# Patient Record
Sex: Male | Born: 1945 | Race: White | Hispanic: No | Marital: Married | Smoking: Never smoker
Health system: Southern US, Community
[De-identification: ages and names within clinical notes are randomized; demographics above are authoritative.]

## PROBLEM LIST (undated history)

## (undated) DIAGNOSIS — M1711 Unilateral primary osteoarthritis, right knee: Secondary | ICD-10-CM

## (undated) DIAGNOSIS — M1611 Unilateral primary osteoarthritis, right hip: Secondary | ICD-10-CM

## (undated) DIAGNOSIS — I10 Essential (primary) hypertension: Secondary | ICD-10-CM

## (undated) DIAGNOSIS — G4730 Sleep apnea, unspecified: Secondary | ICD-10-CM

## (undated) DIAGNOSIS — M1990 Unspecified osteoarthritis, unspecified site: Secondary | ICD-10-CM

## (undated) DIAGNOSIS — M109 Gout, unspecified: Secondary | ICD-10-CM

## (undated) DIAGNOSIS — K219 Gastro-esophageal reflux disease without esophagitis: Secondary | ICD-10-CM

## (undated) DIAGNOSIS — E119 Type 2 diabetes mellitus without complications: Secondary | ICD-10-CM

## (undated) HISTORY — PX: TONSILLECTOMY: SUR1361

## (undated) HISTORY — PX: APPENDECTOMY: SHX54

## (undated) HISTORY — PX: SHOULDER ARTHROSCOPY W/ ROTATOR CUFF REPAIR: SHX2400

---

## 1979-07-11 ENCOUNTER — Encounter: Payer: Self-pay | Admitting: Pulmonary Disease

## 2005-03-19 ENCOUNTER — Ambulatory Visit (HOSPITAL_BASED_OUTPATIENT_CLINIC_OR_DEPARTMENT_OTHER): Admission: RE | Admit: 2005-03-19 | Discharge: 2005-03-19 | Payer: Self-pay | Admitting: Orthopedic Surgery

## 2005-03-19 ENCOUNTER — Ambulatory Visit (HOSPITAL_COMMUNITY): Admission: RE | Admit: 2005-03-19 | Discharge: 2005-03-19 | Payer: Self-pay | Admitting: Orthopedic Surgery

## 2006-09-16 DIAGNOSIS — E119 Type 2 diabetes mellitus without complications: Secondary | ICD-10-CM | POA: Insufficient documentation

## 2007-03-21 ENCOUNTER — Encounter: Admission: RE | Admit: 2007-03-21 | Discharge: 2007-03-21 | Payer: Self-pay | Admitting: Specialist

## 2010-11-09 NOTE — Op Note (Signed)
NAME:  Jeffery Friedman, HARIG NO.:  [id number]   MEDICAL RECORD NO.:  [id number]          PATIENT TYPE:  AMB   LOCATION:  DSC                          FACILITY:  [facility]   PHYSICIAN:  Leonides Grills, M.D.     DATE OF BIRTH:  1945/10/20   DATE OF PROCEDURE:  03/19/2005  DATE OF DISCHARGE:                                 OPERATIVE REPORT   PREOPERATIVE DIAGNOSES:  1.  Left Achilles tendinopathy.  2.  Left tight gastroc.   POSTOPERATIVE DIAGNOSES:  1.  Left Achilles tendinopathy.  2.  Left tight gastroc.   OPERATION/PROCEDURE:  1.  Left debridement of Achilles tendon.  2.  Left flexor hallucis longus to calcaneus transfer.  3.  Left gastroc side.   ANESTHESIA:  General.   SURGEON:  Leonides Grills, M.D.   ASSISTANT:  Lianne Cure, P.A.-C.   ESTIMATED BLOOD LOSS:  Minimal.   TOURNIQUET TIME:  Approximately 1 hour and 20 minutes.   COMPLICATIONS:  None.   DISPOSITION:  Stable to the PR.   INDICATIONS:  This is a 65 year old male who has had progressive  longstanding left Achilles tendinopathy and pain despite conservative  management.  He has come for the above procedure all risks which include  infection, neurovascular injury, contracture, persistent pain, worsening  pain, prolonged recovery, Achilles tendon rupture were all explained,  questions were encouraged and answered.   DESCRIPTION OF PROCEDURE:  The patient was brought to the operating room,  placed in the supine position.  After adequate general endotracheal  anesthesia was administered, as well as Ancef 1 g IV piggyback, the patient  was then placed in the sloppy lateral position with the operative site down.  All bony prominences were well padded on a bean bag. The left lower  extremity was then prepped and draped in the sterile manner with a proximal  placed thigh tourniquet.   A longitudinal incision over the medial aspect of the gastrocnemius muscle  tendinous junction was then made.   Dissection was carried down through the  skin and hemostasis was attained.  The fascia was opened in line with the  incision.  Conjoin region was then developed between the soleus and gastroc.  Soft tissue was then elevated off the posterior aspect of the gastrocnemius  tendon.  Sural nerve was identified and protected posteriorly throughout the  case.  The gastrocnemius tendon was then released with a curved Mayo  scissors __________  release the tight gastroc area.  The area was copiously  irrigated with normal saline.  Subcutaneous tissue was closed with 3-0  Vicryl.  Skin was closed with 4-0 Monocryl subcuticular stitch.  Steri-  Strips were applied.   We then __________  the left lower extremity and tourniquet was elevated 290  mmHg.  A longitudinal incision just anterior medial to the Achilles tendon  was then made.  Dissection was carried down through the skin and hemostasis  was obtained.  Fascia was opened in line with the incision.  Dissection was  carried down to the FHL tendon, protecting the neurovascular structures  medially.  The FHL tendon  was then identified and the fascia was then opened  proximally and distally.  The tendon was then traced to the __________  and  then with the ankle at maximum equinus and the great toe at maximal equinus,  the FHL tendon was tenotomized as distal as possible and then pulled through  the wound.  A #2 FiberWire was then placed in a modified Krackow-type stitch  into the distal aspect of the tendon measuring approximately 25 mm.  Once  this was done, we then prepared the superior aspect of the calcaneal tuber.  We then placed a guidewire into the superior aspect of the calcaneal tuber  and drilled 8 mm drill hole to a depth of about 30 mm.  We then chose an 8 x  23 mm [employer] tenodesis bio-absorbable screw and placed this on the  screwdriver with #2 FiberWire as a lasso, lassoed the tendon, pushed this  down deep into the calcaneal tunnel  to the deepest part of the canal and  then tightened this screw.  This had excellent purchase and maintenance of  the adequate tension and position that we assessed before tendon transfer.  The muscle belly was directly in front of the Achilles tendon.  At this  point we did a debridement of the Achilles tendon with longitudinal slits  within the Achilles tendon over the tendinopathy zone.  We then sewed the  FHL to the Achilles tendon distally with #2 FiberWire and then gently teased  and sewed the FHL muscle belly to the anterior aspect of the Achilles tendon  through epimysium and paratenon layers.  Once this was done, it was  excellent position.  We left the foot in a gravity equinus position.  Also  of note, the FHL was elevated off the posterior aspect of the tibia as well  the gain more excursion.  Tourniquet was deflated.  Hemostasis was obtained.  There was no pulsatile bleeding.  The subcutaneous was closed with 3-0  Vicryl.  Skin was closed with 4-0 nylon.  Sterile dressing was applied.  Modified Jones dressing was applied with the gravity equinus.  The patient  went stable to the PR.      Leonides Grills, M.D.  Electronically Signed     PB/MEDQ  D:  03/19/2005  T:  03/20/2005  Job:  [id number]

## 2013-03-03 ENCOUNTER — Encounter: Payer: Self-pay | Admitting: Internal Medicine

## 2013-03-03 ENCOUNTER — Ambulatory Visit (INDEPENDENT_AMBULATORY_CARE_PROVIDER_SITE_OTHER): Payer: Medicare Other | Admitting: Internal Medicine

## 2013-03-03 VITALS — BP 121/74 | HR 68 | Temp 98.1°F | Wt 232.0 lb

## 2013-03-03 DIAGNOSIS — A4902 Methicillin resistant Staphylococcus aureus infection, unspecified site: Secondary | ICD-10-CM

## 2013-03-03 LAB — CBC WITH DIFFERENTIAL/PLATELET
HCT: 37.8 % — ABNORMAL LOW (ref 39.0–52.0)
Hemoglobin: 12.9 g/dL — ABNORMAL LOW (ref 13.0–17.0)
Lymphocytes Relative: 28 % (ref 12–46)
Lymphs Abs: 1.4 10*3/uL (ref 0.7–4.0)
MCHC: 34.1 g/dL (ref 30.0–36.0)
Monocytes Absolute: 0.6 10*3/uL (ref 0.1–1.0)
Monocytes Relative: 11 % (ref 3–12)
Neutro Abs: 2.8 10*3/uL (ref 1.7–7.7)
RBC: 4.15 MIL/uL — ABNORMAL LOW (ref 4.22–5.81)
WBC: 4.9 10*3/uL (ref 4.0–10.5)

## 2013-03-03 LAB — BASIC METABOLIC PANEL
BUN: 23 mg/dL (ref 6–23)
CO2: 23 mEq/L (ref 19–32)
Chloride: 103 mEq/L (ref 96–112)
Glucose, Bld: 98 mg/dL (ref 70–99)
Potassium: 4.7 mEq/L (ref 3.5–5.3)

## 2013-03-03 MED ORDER — CHLORHEXIDINE GLUCONATE 4 % EX SOLN: 1.0000 "application " | Freq: Every day | CUTANEOUS | Status: DC

## 2013-03-03 NOTE — Progress Notes (Signed)
[facility] NOTE  RFV: community referral for MRSA SSTI of scalp Subjective:    Patient ID: Jeffery Friedman, male    DOB: 09-21-1945, 67 y.o.   MRN: [id number]  HPI  started having sizeable lumps, painful on scalp for roughly 4-6 wks. One as large as "golf ball" Itchy, crusted. Appeared to be as large on 3 x 3 cm ulcerated nodules Expressed at doctors office, then biopsied with culture results showing MRSA. Thus far they have shrunked considerably since being on bactrim DS 2 tab BID (which he previously was on 1 DS BID tab) since August 29th.  No fever or chills, but never occurred.   ROS: no chills, nausea, vomitting, diarrhea, sick contacts   Current outpatient prescriptions:allopurinol (ZYLOPRIM) 300 MG tablet, Take 300 mg by mouth daily., Disp: , Rfl: ;  amLODipine (NORVASC) 5 MG tablet, Take 5 mg by mouth daily., Disp: , Rfl: ;  aspirin 81 MG tablet, Take 81 mg by mouth daily., Disp: , Rfl: ;  azelastine (ASTELIN) 137 MCG/SPRAY nasal spray, Place 1 spray into the nose 2 (two) times daily. Use in each nostril as directed, Disp: , Rfl:  benazepril (LOTENSIN) 20 MG tablet, Take 20 mg by mouth daily., Disp: , Rfl: ;  fluticasone (VERAMYST) 27.5 MCG/SPRAY nasal spray, Place 2 sprays into the nose daily., Disp: , Rfl: ;  indomethacin (INDOCIN) 50 MG capsule, Take 50 mg by mouth 2 (two) times daily with a meal., Disp: , Rfl: ;  levocetirizine (XYZAL) 5 MG tablet, Take 5 mg by mouth every evening., Disp: , Rfl:  metFORMIN (GLUCOPHAGE) 1000 MG tablet, Take 500 mg by mouth 2 (two) times daily with a meal., Disp: , Rfl: ;  montelukast (SINGULAIR) 10 MG tablet, Take 10 mg by mouth at bedtime., Disp: , Rfl: ;  omeprazole (PRILOSEC) 20 MG capsule, Take 20 mg by mouth daily., Disp: , Rfl: ;  rosuvastatin (CRESTOR) 10 MG tablet, Take 10 mg by mouth every other day., Disp: , Rfl:  sulfamethoxazole-trimethoprim (BACTRIM DS) 800-160 MG per tablet, Take 2 tablets by mouth 2 (two) times daily., Disp: , Rfl:     Active Ambulatory Problems    Diagnosis Date Noted  . No Active Ambulatory Problems   Resolved Ambulatory Problems    Diagnosis Date Noted  . No Resolved Ambulatory Problems   No Additional Past Medical History   History  Substance Use Topics  . Smoking status: Never Smoker   . Smokeless tobacco: Never Used  . Alcohol Use: No  -retired, married, cares for grandchildren, -- they are in good state of health  family history is not on file.  Review of Systems  Constitutional: Negative for fever, chills, diaphoresis, activity change, appetite change, fatigue and unexpected weight change.  HENT: Negative for congestion, sore throat, rhinorrhea, sneezing, trouble swallowing and sinus pressure.  Eyes: Negative for photophobia and visual disturbance.  Respiratory: Negative for cough, chest tightness, shortness of breath, wheezing and stridor.  Cardiovascular: Negative for chest pain, palpitations and leg swelling.  Gastrointestinal: Negative for nausea, vomiting, abdominal pain, diarrhea, constipation, blood in stool, abdominal distention and anal bleeding.  Genitourinary: Negative for dysuria, hematuria, flank pain and difficulty urinating.  Musculoskeletal: Negative for myalgias, back pain, joint swelling, arthralgias and gait problem.  Skin: Negative for color change, pallor, rash and wound.  Neurological: Negative for dizziness, tremors, weakness and light-headedness.  Hematological: Negative for adenopathy. Does not bruise/bleed easily.  Psychiatric/Behavioral: Negative for behavioral problems, confusion, sleep disturbance, dysphoric mood, decreased concentration and agitation.  Objective:   Physical Exam BP 121/74  Pulse 68  Temp(Src) 98.1 F (36.7 C) (Oral)  Wt 232 lb (105.235 kg) Physical Exam  Constitutional: He is oriented to person, place, and time. He appears well-developed and well-nourished. No distress.  HENT:  Mouth/Throat: Oropharynx is clear and  moist. No oropharyngeal exudate.  Cardiovascular: Normal rate, regular rhythm and normal heart sounds. Exam reveals no gallop and no friction rub.  No murmur heard.  Pulmonary/Chest: Effort normal and breath sounds normal. No respiratory distress. He has no wheezes.  Abdominal: Soft. Bowel sounds are normal. He exhibits no distension. There is no tenderness.  Lymphadenopathy:  He has no cervical adenopathy.  Neurological: He is alert and oriented to person, place, and time.  Skin: vertex of head has 3 lesions, open ulcer, still has fibrinous exudate. Left sided lesions has still some purulent exudate that is expressed Psychiatric: He has a normal mood and affect. His behavior is normal.    Lab: tetracycline S, rif S, bactrim S, clinda S = 02/19/13 tissue culture     Assessment & Plan:  MRSA SSTI = currently on day 12 of 14 of bactrim DS 2 tab BID. Doing better but not completely resolved. Will do addn week of therapy. Will check cbc, bmp. Will do a 7 day course of CHG bath to help with decolonization  Cell: [phone]

## 2013-03-08 ENCOUNTER — Telehealth: Payer: Self-pay | Admitting: *Deleted

## 2013-03-08 ENCOUNTER — Encounter: Payer: Self-pay | Admitting: Internal Medicine

## 2013-03-08 NOTE — Telephone Encounter (Signed)
Pt email via MyChart. Please advise. Andree Coss, RN   I have not received the results of my blood work yet. Does that mean everything was O.K.? I finish my antibodic tomorrow--you said you may be ordering more for me depending on my blood work so I was wondering if I need more or just wait until I see you on the 22nd? Thanks Jeffery Friedman

## 2013-03-15 ENCOUNTER — Ambulatory Visit (INDEPENDENT_AMBULATORY_CARE_PROVIDER_SITE_OTHER): Payer: Medicare Other | Admitting: Internal Medicine

## 2013-03-15 ENCOUNTER — Encounter: Payer: Self-pay | Admitting: Internal Medicine

## 2013-03-15 VITALS — BP 126/72 | HR 71 | Temp 98.4°F | Ht 73.0 in | Wt 232.0 lb

## 2013-03-15 DIAGNOSIS — A4902 Methicillin resistant Staphylococcus aureus infection, unspecified site: Secondary | ICD-10-CM

## 2013-03-15 NOTE — Progress Notes (Signed)
[facility] NOTE  RFV: follow up on MRSA skin/deep tissue infection of scalp Subjective:    Patient ID: Jeffery Friedman, male    DOB: 1945/07/30, 67 y.o.   MRN: [id number]  HPI started having sizeable lumps, painful on scalp for roughly 2 months ago. One as large as "golf ball" Itchy, crusted. Appeared to be as large on 3 x 3 cm ulcerated nodules Expressed at doctors office, then biopsied with culture results showing MRSA. Thus far they have shrunked considerably since being on bactrim DS 2 tab BID (which he previously was on 1 DS BID tab) since August 29th.  Has not had any drainage for the last 4 days ago. Took his last dose of bactrim 1 wk ago. He noticed having acid reflux after stopping antibiotic. He double taking prilosec which help his symptoms. He also finished chg bathing.  No fever or chills, but never occurred.   Current Outpatient Prescriptions on File Prior to Visit  Medication Sig Dispense Refill  . allopurinol (ZYLOPRIM) 300 MG tablet Take 300 mg by mouth daily.      Marland Kitchen amLODipine (NORVASC) 5 MG tablet Take 5 mg by mouth daily.      Marland Kitchen aspirin 81 MG tablet Take 81 mg by mouth daily.      Marland Kitchen azelastine (ASTELIN) 137 MCG/SPRAY nasal spray Place 1 spray into the nose 2 (two) times daily. Use in each nostril as directed      . benazepril (LOTENSIN) 20 MG tablet Take 20 mg by mouth daily.      . fluticasone (VERAMYST) 27.5 MCG/SPRAY nasal spray Place 2 sprays into the nose daily.      . indomethacin (INDOCIN) 50 MG capsule Take 50 mg by mouth 2 (two) times daily with a meal.      . levocetirizine (XYZAL) 5 MG tablet Take 5 mg by mouth every evening.      . metFORMIN (GLUCOPHAGE) 1000 MG tablet Take 500 mg by mouth 2 (two) times daily with a meal.      . montelukast (SINGULAIR) 10 MG tablet Take 10 mg by mouth at bedtime.      Marland Kitchen omeprazole (PRILOSEC) 20 MG capsule Take 20 mg by mouth daily.      . rosuvastatin (CRESTOR) 10 MG tablet Take 10 mg by mouth every other day.      .  sulfamethoxazole-trimethoprim (BACTRIM DS) 800-160 MG per tablet Take 2 tablets by mouth 2 (two) times daily.       No current facility-administered medications on file prior to visit.   Active Ambulatory Problems    Diagnosis Date Noted  . No Active Ambulatory Problems   Resolved Ambulatory Problems    Diagnosis Date Noted  . No Resolved Ambulatory Problems   No Additional Past Medical History    Review of Systems no chills, nausea, vomitting, diarrhea, sick contacts    Objective:   Physical Exam BP 126/72  Pulse 71  Temp(Src) 98.4 F (36.9 C) (Oral)  Ht 6\' 1"  (1.854 m)  Wt 232 lb (105.235 kg)  BMI 30.62 kg/m2 gen = a x o by 3 in NAD Skin = lesions on scalp are well healed. Still has 1 cm scab to frontal lesion. Mild erythema, non blanching no fluctuance      Assessment & Plan:  mrsa skin infection = finished course of bactrim. Lesions are healing as expected. Will not need further antibiotics at this time.  rtc prn

## 2013-04-29 ENCOUNTER — Other Ambulatory Visit: Payer: Self-pay

## 2015-08-02 DIAGNOSIS — E785 Hyperlipidemia, unspecified: Secondary | ICD-10-CM | POA: Insufficient documentation

## 2015-08-02 DIAGNOSIS — I10 Essential (primary) hypertension: Secondary | ICD-10-CM | POA: Insufficient documentation

## 2015-08-02 DIAGNOSIS — K2100 Gastro-esophageal reflux disease with esophagitis, without bleeding: Secondary | ICD-10-CM | POA: Insufficient documentation

## 2015-08-02 DIAGNOSIS — K5730 Diverticulosis of large intestine without perforation or abscess without bleeding: Secondary | ICD-10-CM | POA: Insufficient documentation

## 2015-08-02 DIAGNOSIS — R21 Rash and other nonspecific skin eruption: Secondary | ICD-10-CM | POA: Insufficient documentation

## 2015-08-02 DIAGNOSIS — K449 Diaphragmatic hernia without obstruction or gangrene: Secondary | ICD-10-CM | POA: Insufficient documentation

## 2015-08-02 DIAGNOSIS — M109 Gout, unspecified: Secondary | ICD-10-CM | POA: Insufficient documentation

## 2015-08-02 DIAGNOSIS — M654 Radial styloid tenosynovitis [de Quervain]: Secondary | ICD-10-CM | POA: Insufficient documentation

## 2015-08-02 DIAGNOSIS — H9190 Unspecified hearing loss, unspecified ear: Secondary | ICD-10-CM | POA: Insufficient documentation

## 2016-05-23 ENCOUNTER — Encounter: Payer: Self-pay | Admitting: Pulmonary Disease

## 2016-05-23 ENCOUNTER — Ambulatory Visit (INDEPENDENT_AMBULATORY_CARE_PROVIDER_SITE_OTHER): Payer: Medicare Other | Admitting: Pulmonary Disease

## 2016-05-23 VITALS — BP 132/76 | HR 74 | Ht 73.0 in | Wt 248.4 lb

## 2016-05-23 DIAGNOSIS — G4733 Obstructive sleep apnea (adult) (pediatric): Secondary | ICD-10-CM | POA: Diagnosis not present

## 2016-05-23 NOTE — Progress Notes (Signed)
Subjective:    Patient ID: Jeffery Friedman, male    DOB: 1946-05-11, 70 y.o.   MRN: [id number]  HPI   This is the case of Jeffery Friedman, 70 y.o. Male, who was is being seen for OSA. He is a non smoker, has mild asthma. Not been diagnosed with COPD. He has allergic sinusitis.   As you very well know, patient was diagnosed with obstructive sleep apnea 2009. Prior to that, he had snoring, witnessed apneas, gasping, choking, unrefreshed sleep. He had a colonoscopy and was difficult to arouse when he received sedatives and he had O2 desaturation.  Patient had a sleep study done at [facility] in [place] in 2009. Study was done on 03/16/2008. It was a split-night study. AHI was 66. He was optimal on CPAP 12-13 centimeters water.   He has been using his CPAP machine since 2009. Through the years, CPAP machine has lost its luster. The water reservoir/heater  isn't working as well. Sometimes, machine does not deliver enough pressure. It's an old machine so it does not have an SD card. He slowly becoming symptomatic again. ESS 14.   (-) abnormal behavior in sleep.  He gets supplies in the mail. No download has been done for years.    Review of Systems  Constitutional: Negative.  Negative for fever and unexpected weight change.  HENT: Negative.  Negative for congestion, dental problem, ear pain, nosebleeds, postnasal drip, rhinorrhea, sinus pressure, sneezing, sore throat and trouble swallowing.   Eyes: Negative.  Negative for redness and itching.  Respiratory: Negative.  Negative for cough, chest tightness, shortness of breath and wheezing.   Cardiovascular: Negative.  Negative for palpitations and leg swelling.  Gastrointestinal: Negative.  Negative for nausea and vomiting.  Endocrine: Negative.   Genitourinary: Negative.  Negative for dysuria.  Musculoskeletal: Negative.  Negative for joint swelling.  Skin: Negative.  Negative for rash.  Allergic/Immunologic: Positive for environmental  allergies.  Neurological: Negative.  Negative for headaches.  Hematological: Bruises/bleeds easily.  Psychiatric/Behavioral: Negative.  Negative for dysphoric mood. The patient is not nervous/anxious.    No past medical history on file.  (-) CAD, CA, CVA (+) DM, HTN (+) allergic sinusitis (-) DVT (+) Gout  No family history on file.  Parents are deceased. Mother had lymphatic CA. Father had PVD.   No past surgical history on file.  S/P R shoulder and R knee and L ankle sugery.   Social History   Social History  . Marital status: Married    Spouse name: N/A  . Number of children: N/A  . Years of education: N/A   Occupational History  . Not on file.   Social History Main Topics  . Smoking status: Never Smoker  . Smokeless tobacco: Never Used  . Alcohol use No  . Drug use: No  . Sexual activity: Yes    Partners: Female   Other Topics Concern  . Not on file   Social History Narrative  . No narrative on file     Allergies  Allergen Reactions  . Hydrocodone-Homatropine Other (See Comments)    unknown   Married, lives in [place], worked at [employer] in [place] and has been in [employer] since 2005. Moderate ETOH.   Outpatient Medications Prior to Visit  Medication Sig Dispense Refill  . allopurinol (ZYLOPRIM) 300 MG tablet Take 300 mg by mouth daily.    Marland Kitchen amLODipine (NORVASC) 5 MG tablet Take 5 mg by mouth daily.    Marland Kitchen aspirin  81 MG tablet Take 81 mg by mouth daily.    Marland Kitchen. azelastine (ASTELIN) 137 MCG/SPRAY nasal spray Place 1 spray into the nose 2 (two) times daily. Use in each nostril as directed    . benazepril (LOTENSIN) 20 MG tablet Take 20 mg by mouth daily.    . indomethacin (INDOCIN) 50 MG capsule Take 50 mg by mouth 2 (two) times daily with a meal.    . levocetirizine (XYZAL) 5 MG tablet Take 5 mg by mouth every evening.    . metFORMIN (GLUCOPHAGE) 1000 MG tablet Take 500 mg by mouth 2 (two) times daily with a meal.    . montelukast (SINGULAIR) 10 MG tablet  Take 10 mg by mouth at bedtime.    . rosuvastatin (CRESTOR) 10 MG tablet Take 10 mg by mouth every other day.    Marland Kitchen. omeprazole (PRILOSEC) 20 MG capsule Take 20 mg by mouth daily.    . fluticasone (VERAMYST) 27.5 MCG/SPRAY nasal spray Place 2 sprays into the nose daily.    Marland Kitchen. sulfamethoxazole-trimethoprim (BACTRIM DS) 800-160 MG per tablet Take 2 tablets by mouth 2 (two) times daily.     No facility-administered medications prior to visit.    Meds ordered this encounter  Medications  . diclofenac (VOLTAREN) 75 MG EC tablet    Sig: Take 75 mg by mouth.  . fluticasone (FLONASE) 50 MCG/ACT nasal spray    Sig: USE 1-2 SPRAYS IN EACH NOSTRIL EVERY DAY  . pantoprazole (PROTONIX) 40 MG tablet    Sig: TAKE 1 TABLET DAILY        Objective:   Physical Exam  Vitals:  Vitals:   05/23/16 0939  BP: 132/76  Pulse: 74  SpO2: 95%  Weight: 248 lb 6.4 oz (112.7 kg)  Height: 6\' 1"  (1.854 m)    Constitutional/General:  Pleasant, well-nourished, well-developed, not in any distress,  Comfortably seating.  Well kempt  Body mass index is 32.77 kg/m. Wt Readings from Last 3 Encounters:  05/23/16 248 lb 6.4 oz (112.7 kg)  03/15/13 232 lb (105.2 kg)  03/03/13 232 lb (105.2 kg)    Neck circumference:   HEENT: Pupils equal and reactive to light and accommodation. Anicteric sclerae. Normal nasal mucosa.   No oral  lesions,  mouth clear,  oropharynx clear, no postnasal drip. (-) Oral thrush. No dental caries.  Airway - Mallampati class III  Neck: No masses. Midline trachea. No JVD, (-) LAD. (-) bruits appreciated.  Respiratory/Chest: Grossly normal chest. (-) deformity. (-) Accessory muscle use.  Symmetric expansion. (-) Tenderness on palpation.  Resonant on percussion.  Diminished BS on both lower lung zones. (-) wheezing, crackles, rhonchi (-) egophony  Cardiovascular: Regular rate and  rhythm, heart sounds normal, Gr 2 apical murmur. (-) gallops, no peripheral edema  Gastrointestinal:   Normal bowel sounds. Soft, non-tender. No hepatosplenomegaly.  (-) masses.   Musculoskeletal:  Normal muscle tone. Normal gait.   Extremities: Grossly normal. (-) clubbing, cyanosis.  (-) edema  Skin: (-) rash,lesions seen.   Neurological/Psychiatric : alert, oriented to time, place, person. Normal mood and affect          Assessment & Plan:  OSA (obstructive sleep apnea) Patient was diagnosed with obstructive sleep apnea 2009. Prior to that, he had snoring, witnessed apneas, gasping, choking, unrefreshed sleep. He had a colonoscopy and was difficult to arouse when he received sedatives and he had O2 desaturation.  Patient had [facility] done [facility] in [place] in 2009. Study was  done on 03/16/2008. It was a split-night study. AHI was 66. He was optimal on CPAP 12-13 centimeters water.   He has been using his CPAP machine since 2009. Through the years, CPAP machine has lost its luster. The water reservoir/heater  isn't working as well. Sometimes, machine does not deliver enough pressure. It's an old machine so it does not have an SD card. He slowly becoming symptomatic again. ESS 14.   (-) abnormal behavior in sleep.  He gets supplies in the mail. No download has been done for years.   Plan :  We extensively discussed the diagnosis, pathophysiology, and treatment options for Obstructive Sleep Apnea (OSA).  We discussed treatment options for OSA including CPAP, BiPaP, as well as surgical options and oral devices.   We will start patient on autocpap 5-15 cm water. We have a copy of his sleep study done in 2009. We will try to order an auto CPAP without repeating the study. He now has Medicare so most likely Medicare will want him to have another study. If ever, he will have a split-night lab study.  Patient was instructed to call the office if he/she has not received the PAP device in 1-2 weeks.  Patient was instructed to have mask, tubings, filter, reservoir  cleaned at least once a week with soapy water.  Patient was instructed to call the office if he/she is having issues with the PAP device.    I advised patient to obtain sufficient amount of sleep --  7 to 8 hours at least in a 24 hr period.  Patient was advised to follow good sleep hygiene.  Patient was advised NOT to engage in activities requiring concentration and/or vigilance if he/she is and  sleepy.  Patient is NOT to drive if he/she is sleepy.        Thank you very much for letting me participate in this patient's care. Please do not hesitate to give me a call if you have any questions or concerns regarding the treatment plan.   Patient will follow up with me in 6-8 weeks.     Pollie MeyerJ. Angelo A. de Dios, MD 05/23/2016   10:14 AM Pulmonary and Critical Care Medicine [facility] Pager: [phone] Office: [phone], Fax: [phone]

## 2016-05-23 NOTE — Patient Instructions (Signed)
   It was a pleasure taking care of you today!  You are diagnosed with Obstructive Sleep Apnea or OSA.  You stop breathing  66   times an hour.   We will order you an autoCPAP  machine. Medicare might want to to have a lab study. We will keep you posted. Please call the office if you do NOT receive your machine in the next 1-2 weeks.   Please make sure you use your CPAP device everytime you sleep.  We will monitor the usage of your machine per your insurance requirement.  Your insurance company may take the machine from you if you are not using it regularly.   Please clean the mask, tubings, filter, water reservoir with soapy water every week.  Please use distilled water for the water reservoir.   Please call the office or your machine provider (DME company) if you are having issues with the device.   Return to clinic in 6-8 weeks with Dr. Christene Slatese Dios or NP.

## 2016-05-23 NOTE — Assessment & Plan Note (Signed)
Patient was diagnosed with obstructive sleep apnea 2009. Prior to that, he had snoring, witnessed apneas, gasping, choking, unrefreshed sleep. He had a colonoscopy and was difficult to arouse when he received sedatives and he had O2 desaturation.  Patient had a sleep study done at [facility] in [place] in 2009. Study was done on 03/16/2008. It was a split-night study. AHI was 66. He was optimal on CPAP 12-13 centimeters water.   He has been using his CPAP machine since 2009. Through the years, CPAP machine has lost its luster. The water reservoir/heater  isn't working as well. Sometimes, machine does not deliver enough pressure. It's an old machine so it does not have an SD card. He slowly becoming symptomatic again. ESS 14.   (-) abnormal behavior in sleep.  He gets supplies in the mail. No download has been done for years.   Plan :  We extensively discussed the diagnosis, pathophysiology, and treatment options for Obstructive Sleep Apnea (OSA).  We discussed treatment options for OSA including CPAP, BiPaP, as well as surgical options and oral devices.   We will start patient on autocpap 5-15 cm water. We have a copy of his sleep study done in 2009. We will try to order an auto CPAP without repeating the study. He now has Medicare so most likely Medicare will want him to have another study. If ever, he will have a split-night lab study.  Patient was instructed to call the office if he/she has not received the PAP device in 1-2 weeks.  Patient was instructed to have mask, tubings, filter, reservoir cleaned at least once a week with soapy water.  Patient was instructed to call the office if he/she is having issues with the PAP device.    I advised patient to obtain sufficient amount of sleep --  7 to 8 hours at least in a 24 hr period.  Patient was advised to follow good sleep hygiene.  Patient was advised NOT to engage in activities requiring concentration and/or vigilance if he/she is  and  sleepy.  Patient is NOT to drive if he/she is sleepy.

## 2016-07-05 ENCOUNTER — Ambulatory Visit: Payer: Medicare Other | Admitting: Adult Health

## 2016-07-12 ENCOUNTER — Ambulatory Visit (INDEPENDENT_AMBULATORY_CARE_PROVIDER_SITE_OTHER): Payer: Medicare Other | Admitting: Adult Health

## 2016-07-12 ENCOUNTER — Encounter: Payer: Self-pay | Admitting: Adult Health

## 2016-07-12 DIAGNOSIS — Z6839 Body mass index (BMI) 39.0-39.9, adult: Secondary | ICD-10-CM | POA: Diagnosis not present

## 2016-07-12 DIAGNOSIS — E6609 Other obesity due to excess calories: Secondary | ICD-10-CM | POA: Diagnosis not present

## 2016-07-12 DIAGNOSIS — G4733 Obstructive sleep apnea (adult) (pediatric): Secondary | ICD-10-CM

## 2016-07-12 DIAGNOSIS — E669 Obesity, unspecified: Secondary | ICD-10-CM | POA: Insufficient documentation

## 2016-07-12 DIAGNOSIS — IMO0001 Reserved for inherently not codable concepts without codable children: Secondary | ICD-10-CM

## 2016-07-12 NOTE — Progress Notes (Signed)
@Patient  ID: Jeffery Friedman, male    DOB: [birth year], 71 y.o.   MRN: [id number]  Chief Complaint  Patient presents with  . Follow-up    OSA     Referring provider: Barbie Banner, MD  HPI: 71 yo male followed for severe OSA   TEST  sleep study done at [facility] in [place] in 2009. Study was done on 03/16/2008. It was a split-night study. AHI was 66. He was optimal on CPAP 12-13 centimeters water.   07/12/2016 Follow up : OSA  Pt returns for 2 month follow up . He recently got a new CPAP machine and is doing very well.  Likes the auto set , more comfortable. Feels rested.  He wears every night for 8-9 hr .  Denies daytime sleepiness.  Download shows excellent compliance with avg usage at 9hr . AHI 2.7 , min leaks.     Allergies  Allergen Reactions  . Hydrocodone-Homatropine Other (See Comments)    unknown    Immunization History  Administered Date(s) Administered  . Influenza, High Dose Seasonal PF 04/24/2016    No past medical history on file.  Tobacco History: History  Smoking Status  . Never Smoker  Smokeless Tobacco  . Never Used   Counseling given: Not Answered   Outpatient Encounter Prescriptions as of 07/12/2016  Medication Sig  . allopurinol (ZYLOPRIM) 300 MG tablet Take 300 mg by mouth daily.  Marland Kitchen amLODipine (NORVASC) 5 MG tablet Take 5 mg by mouth daily.  Marland Kitchen aspirin 81 MG tablet Take 81 mg by mouth daily.  Marland Kitchen azelastine (ASTELIN) 137 MCG/SPRAY nasal spray Place 1 spray into the nose 2 (two) times daily. Use in each nostril as directed  . benazepril (LOTENSIN) 20 MG tablet Take 20 mg by mouth daily.  . fluticasone (FLONASE) 50 MCG/ACT nasal spray USE 1-2 SPRAYS IN EACH NOSTRIL EVERY DAY  . indomethacin (INDOCIN) 50 MG capsule Take 50 mg by mouth 2 (two) times daily with a meal.  . levocetirizine (XYZAL) 5 MG tablet Take 5 mg by mouth every evening.  . metFORMIN (GLUCOPHAGE) 1000 MG tablet Take 500 mg by mouth 2 (two) times daily with a meal.  .  montelukast (SINGULAIR) 10 MG tablet Take 10 mg by mouth at bedtime.  Marland Kitchen omeprazole (PRILOSEC) 20 MG capsule Take 20 mg by mouth daily.  . pantoprazole (PROTONIX) 40 MG tablet TAKE 1 TABLET DAILY  . rosuvastatin (CRESTOR) 10 MG tablet Take 10 mg by mouth every other day.  . [DISCONTINUED] diclofenac (VOLTAREN) 75 MG EC tablet Take 75 mg by mouth.   No facility-administered encounter medications on file as of 07/12/2016.      Review of Systems  Constitutional:   No  weight loss, night sweats,  Fevers, chills, fatigue, or  lassitude.  HEENT:   No headaches,  Difficulty swallowing,  Tooth/dental problems, or  Sore throat,                No sneezing, itching, ear ache, nasal congestion, post nasal drip,   CV:  No chest pain,  Orthopnea, PND, swelling in lower extremities, anasarca, dizziness, palpitations, syncope.   GI  No heartburn, indigestion, abdominal pain, nausea, vomiting, diarrhea, change in bowel habits, loss of appetite, bloody stools.   Resp: No shortness of breath with exertion or at rest.  No excess mucus, no productive cough,  No non-productive cough,  No coughing up of blood.  No change in color of mucus.  No wheezing.  No  chest wall deformity  Skin: no rash or lesions.  GU: no dysuria, change in color of urine, no urgency or frequency.  No flank pain, no hematuria   MS:  No joint pain or swelling.  No decreased range of motion.  No back pain.    Physical Exam  BP 138/70   Pulse 66   Temp 97.9 F (36.6 C) (Oral)   Ht 6\' 1"  (1.854 m)   Wt 247 lb 6.4 oz (112.2 kg)   SpO2 96%   BMI 32.64 kg/m   GEN: A/Ox3; pleasant , NAD, obese    HEENT:  [place]/AT,  EACs-clear, TMs-wnl, NOSE-clear, THROAT-clear, no lesions, no postnasal drip or exudate noted. Class 3 MP airway   NECK:  Supple w/ fair ROM; no JVD; normal carotid impulses w/o bruits; no thyromegaly or nodules palpated; no lymphadenopathy.    RESP  Clear  P & A; w/o, wheezes/ rales/ or rhonchi. no accessory muscle  use, no dullness to percussion  CARD:  RRR, no m/r/g, tr  peripheral edema, pulses intact, no cyanosis or clubbing.  GI:   Soft & nt; nml bowel sounds; no organomegaly or masses detected.   Musco: Warm bil, no deformities or joint swelling noted.   Neuro: alert, no focal deficits noted.    Skin: Warm, no lesions or rashes  Psych:  No change in mood or affect. No depression or anxiety.  No memory loss.  Lab Results:  CBC    Component Value Date/Time   WBC 4.9 03/03/2013 1721   RBC 4.15 (L) 03/03/2013 1721   HGB 12.9 (L) 03/03/2013 1721   HCT 37.8 (L) 03/03/2013 1721   PLT 238 03/03/2013 1721   MCV 91.1 03/03/2013 1721   MCH 31.1 03/03/2013 1721   MCHC 34.1 03/03/2013 1721   RDW 15.3 03/03/2013 1721   LYMPHSABS 1.4 03/03/2013 1721   MONOABS 0.6 03/03/2013 1721   EOSABS 0.2 03/03/2013 1721   BASOSABS 0.0 03/03/2013 1721    BMET    Component Value Date/Time   NA 136 03/03/2013 1721   K 4.7 03/03/2013 1721   CL 103 03/03/2013 1721   CO2 23 03/03/2013 1721   GLUCOSE 98 03/03/2013 1721   BUN 23 03/03/2013 1721   CREATININE 1.52 (H) 03/03/2013 1721   CALCIUM 9.2 03/03/2013 1721    BNP No results found for: BNP  ProBNP No results found for: PROBNP  Imaging: No results found.   Assessment & Plan:   OSA (obstructive sleep apnea) Well controlled on CPAP   Plan  Patient Instructions  Keep up good work.  Wear CPAP At bedtime  .  Do not drive if sleepy .  Work on weight loss.  Follow up with Dr. Christene Slatese Dios in 4-6 months and As needed       Obesity Weight loss encouarged.      Rubye Oaksammy Crytal Pensinger, NP 07/12/2016

## 2016-07-12 NOTE — Assessment & Plan Note (Signed)
Weight loss encouarged.  

## 2016-07-12 NOTE — Assessment & Plan Note (Signed)
Well controlled on CPAP   Plan  Patient Instructions  Keep up good work.  Wear CPAP At bedtime  .  Do not drive if sleepy .  Work on weight loss.  Follow up with Dr. Christene Slatese Dios in 4-6 months and As needed

## 2016-07-12 NOTE — Patient Instructions (Signed)
Keep up good work.  Wear CPAP At bedtime  .  Do not drive if sleepy .  Work on weight loss.  Follow up with Dr. Christene Slatese Dios in 4-6 months and As needed

## 2016-09-15 DIAGNOSIS — Z96641 Presence of right artificial hip joint: Secondary | ICD-10-CM | POA: Insufficient documentation

## 2016-12-10 ENCOUNTER — Ambulatory Visit: Payer: Medicare Other | Admitting: Pulmonary Disease

## 2016-12-18 ENCOUNTER — Ambulatory Visit: Payer: Medicare Other | Admitting: Pulmonary Disease

## 2017-02-02 ENCOUNTER — Encounter: Payer: Self-pay | Admitting: Internal Medicine

## 2017-02-03 ENCOUNTER — Ambulatory Visit (INDEPENDENT_AMBULATORY_CARE_PROVIDER_SITE_OTHER): Payer: Medicare Other | Admitting: Internal Medicine

## 2017-02-03 ENCOUNTER — Encounter: Payer: Self-pay | Admitting: Internal Medicine

## 2017-02-03 DIAGNOSIS — G4733 Obstructive sleep apnea (adult) (pediatric): Secondary | ICD-10-CM

## 2017-02-03 DIAGNOSIS — Z6835 Body mass index (BMI) 35.0-35.9, adult: Secondary | ICD-10-CM

## 2017-02-03 NOTE — Assessment & Plan Note (Signed)
Doing extremely well. He likes his machine and mask. Pressures are comfortable and compliance is excellent with good control. He clearly benefits. Plan-continue auto 5-15/[employer]

## 2017-02-03 NOTE — Assessment & Plan Note (Signed)
He would help him to lose a few pounds as discussed.

## 2017-02-03 NOTE — Progress Notes (Signed)
HPI LOV with NP 06/2016-  sleep study done at [facility] in [place] in 2009. Study was done on 03/16/2008. It was a split-night study. AHI was 66. He was optimal on CPAP 12-13 centimeters water.   07/12/2016 Follow up : OSA  Pt returns for 2 month follow up . He recently got a new CPAP machine and is doing very well.  Likes the auto set , more comfortable. Feels rested.  He wears every night for 8-9 hr .  Denies daytime sleepiness.  Download shows excellent compliance with avg usage at 9hr . AHI 2.7 , min leaks.  02/03/17-71 year old male never smoker followed for OSA. Complicating medical problems include morbid obesity Pt states that his CPAP machine is still working good for him. Denies any complaints or concerns. CPAP auto 5-15/ [employer] Download- 100% compliance, AHI 2.3/hour. He averages 8 hours and 37 minutes usage each night. He doesn't want to sleep without CPAP and feels very much better using it. Nasal pillows mask. No concerns or changes requested.  ROS-see HPI    "+" = pos Constitutional:    weight loss, night sweats, fevers, chills, fatigue, lassitude. HEENT:    headaches, difficulty swallowing, tooth/dental problems, sore throat,       sneezing, itching, ear ache, nasal congestion, post nasal drip, snoring CV:    chest pain, orthopnea, PND, swelling in lower extremities, anasarca,                                                   dizziness, palpitations Resp:   shortness of breath with exertion or at rest.               productive cough,   non-productive cough, coughing up of blood.              change in color of mucus.  wheezing.   Skin:    rash or lesions. GI:  No-   heartburn, indigestion, abdominal pain, nausea, vomiting, diarrhea,                 change in bowel habits, loss of appetite GU: dysuria, change in color of urine, no urgency or frequency.   flank pain. MS:   joint pain, stiffness, decreased range of motion, back pain. Neuro-     nothing  unusual Psych:  change in mood or affect.  depression or anxiety.   memory loss.  OBJ- Physical Exam General- Alert, Oriented, Affect-appropriate, Distress- none acute, + overweight Skin- rash-none, lesions- none, excoriation- none Lymphadenopathy- none Head- atraumatic            Eyes- Gross vision intact, PERRLA, conjunctivae and secretions clear            Ears- Hearing, canals-normal            Nose- Clear, no-Septal dev, mucus, polyps, erosion, perforation             Throat- Mallampati IV , mucosa clear , drainage- none, tonsils- atrophic Neck- flexible , trachea midline, no stridor , thyroid nl, carotid no bruit Chest - symmetrical excursion , unlabored           Heart/CV- RRR , no murmur , no gallop  , no rub, nl s1 s2                           -  JVD- none , edema- none, stasis changes- none, varices- none           Lung- clear to P&A, wheeze- none, cough- none , dullness-none, rub- none           Chest wall-  Abd-  Br/ Gen/ Rectal- Not done, not indicated Extrem- cyanosis- none, clubbing, none, atrophy- none, strength- nl Neuro- grossly intact to observation   .

## 2017-02-03 NOTE — Patient Instructions (Signed)
We can continue with CPAP auto 5-15, mask of choice, humidifier, supplies, AirView   Dx OSA  Please call if we can help

## 2017-09-03 ENCOUNTER — Other Ambulatory Visit: Payer: Self-pay | Admitting: Orthopedic Surgery

## 2017-09-04 NOTE — Pre-Procedure Instructions (Signed)
Navon Kotowski  09/04/2017      [id number] - [place], [place] - [address] [place] [address] Phone: [phone] Fax: [phone]    Your procedure is scheduled on March 26  Report to [facility] Admitting at 0530 A.M.  Call this number if you have problems the morning of surgery:  [phone]   Remember:  Do not eat food or drink liquids after midnight.  Continue all medications as directed by your physician except follow these medication instructions before surgery below   Take these medicines the morning of surgery with A SIP OF WATER  allopurinol (ZYLOPRIM) azelastine (ASTELIN) Eye drops if needed fluticasone (FLONASE pantoprazole (PROTONIX)  7 days prior to surgery STOP taking any Aspirin(unless otherwise instructed by your surgeon), Aleve, Naproxen, Ibuprofen, Motrin, Advil, Goody's, BC's, all herbal medications, fish oil, and all vitamins   WHAT DO I DO ABOUT MY DIABETES MEDICATION?   Marland Kitchen Do not take oral diabetes medicines (pills) the morning of surgery. metFORMIN (GLUCOPHAGE)    How to Manage Your Diabetes Before and After Surgery  Why is it important to control my blood sugar before and after surgery? . Improving blood sugar levels before and after surgery helps healing and can limit problems. . A way of improving blood sugar control is eating a healthy diet by: o  Eating less sugar and carbohydrates o  Increasing activity/exercise o  Talking with your doctor about reaching your blood sugar goals . High blood sugars (greater than 180 mg/dL) can raise your risk of infections and slow your recovery, so you will need to focus on controlling your diabetes during the weeks before surgery. . Make sure that the doctor who takes care of your diabetes knows about your planned surgery including the date and location.  How do I manage my blood sugar before surgery? . Check your blood  sugar at least 4 times a day, starting 2 days before surgery, to make sure that the level is not too high or low. o Check your blood sugar the morning of your surgery when you wake up and every 2 hours until you get to the Short Stay unit. . If your blood sugar is less than 70 mg/dL, you will need to treat for low blood sugar: o Do not take insulin. o Treat a low blood sugar (less than 70 mg/dL) with  cup of clear juice (cranberry or apple), 4 glucose tablets, OR glucose gel. o Recheck blood sugar in 15 minutes after treatment (to make sure it is greater than 70 mg/dL). If your blood sugar is not greater than 70 mg/dL on recheck, call [phone] for further instructions. . Report your blood sugar to the short stay nurse when you get to Short Stay.  . If you are admitted to the hospital after surgery: o Your blood sugar will be checked by the staff and you will probably be given insulin after surgery (instead of oral diabetes medicines) to make sure you have good blood sugar levels. o The goal for blood sugar control after surgery is 80-180 mg/dL.    Do not wear jewelry, make-up or nail polish.  Do not wear lotions, powders, or perfumes, or deodorant.  Do not shave 48 hours prior to surgery.  Men may shave face and neck.  Do not bring valuables to the hospital.  [facility] is not responsible for any belongings or valuables.  Contacts,  dentures or bridgework may not be worn into surgery.  Leave your suitcase in the car.  After surgery it may be brought to your room.  For patients admitted to the hospital, discharge time will be determined by your treatment team.  Patients discharged the day of surgery will not be allowed to drive home.    Special instructions:   [place]- Preparing For Surgery  Before surgery, you can play an important role. Because skin is not sterile, your skin needs to be as free of germs as possible. You can reduce the number of germs on your skin by washing  with CHG (chlorahexidine gluconate) Soap before surgery.  CHG is an antiseptic cleaner which kills germs and bonds with the skin to continue killing germs even after washing.  Please do not use if you have an allergy to CHG or antibacterial soaps. If your skin becomes reddened/irritated stop using the CHG.  Do not shave (including legs and underarms) for at least 48 hours prior to first CHG shower. It is OK to shave your face.  Please follow these instructions carefully.   1. Shower the NIGHT BEFORE SURGERY and the MORNING OF SURGERY with CHG.   2. If you chose to wash your hair, wash your hair first as usual with your normal shampoo.  3. After you shampoo, rinse your hair and body thoroughly to remove the shampoo.  4. Use CHG as you would any other liquid soap. You can apply CHG directly to the skin and wash gently with a scrungie or a clean washcloth.   5. Apply the CHG Soap to your body ONLY FROM THE NECK DOWN.  Do not use on open wounds or open sores. Avoid contact with your eyes, ears, mouth and genitals (private parts). Wash Face and genitals (private parts)  with your normal soap.  6. Wash thoroughly, paying special attention to the area where your surgery will be performed.  7. Thoroughly rinse your body with warm water from the neck down.  8. DO NOT shower/wash with your normal soap after using and rinsing off the CHG Soap.  9. Pat yourself dry with a CLEAN TOWEL.  10. Wear CLEAN PAJAMAS to bed the night before surgery, wear comfortable clothes the morning of surgery  11. Place CLEAN SHEETS on your bed the night of your first shower and DO NOT SLEEP WITH PETS.    Day of Surgery: Do not apply any deodorants/lotions. Please wear clean clothes to the hospital/surgery center.      Please read over the following fact sheets that you were given.

## 2017-09-05 ENCOUNTER — Encounter (HOSPITAL_COMMUNITY)
Admission: RE | Admit: 2017-09-05 | Discharge: 2017-09-05 | Disposition: A | Discharge status: Home / Self Care | Payer: Medicare Other | Source: Ambulatory Visit | Attending: Orthopedic Surgery | Admitting: Orthopedic Surgery

## 2017-09-05 ENCOUNTER — Encounter (HOSPITAL_COMMUNITY): Payer: Self-pay

## 2017-09-05 ENCOUNTER — Other Ambulatory Visit: Payer: Self-pay

## 2017-09-05 DIAGNOSIS — Z7982 Long term (current) use of aspirin: Secondary | ICD-10-CM | POA: Diagnosis not present

## 2017-09-05 DIAGNOSIS — Z7984 Long term (current) use of oral hypoglycemic drugs: Secondary | ICD-10-CM | POA: Diagnosis not present

## 2017-09-05 DIAGNOSIS — Z79899 Other long term (current) drug therapy: Secondary | ICD-10-CM | POA: Diagnosis not present

## 2017-09-05 DIAGNOSIS — Z01818 Encounter for other preprocedural examination: Secondary | ICD-10-CM | POA: Insufficient documentation

## 2017-09-05 HISTORY — DX: Sleep apnea, unspecified: G47.30

## 2017-09-05 HISTORY — DX: Gout, unspecified: M10.9

## 2017-09-05 HISTORY — DX: Unilateral primary osteoarthritis, right hip: M16.11

## 2017-09-05 HISTORY — DX: Unspecified osteoarthritis, unspecified site: M19.90

## 2017-09-05 HISTORY — DX: Gastro-esophageal reflux disease without esophagitis: K21.9

## 2017-09-05 HISTORY — DX: Type 2 diabetes mellitus without complications: E11.9

## 2017-09-05 HISTORY — DX: Essential (primary) hypertension: I10

## 2017-09-05 HISTORY — DX: Unilateral primary osteoarthritis, right knee: M17.11

## 2017-09-05 LAB — CBC
HCT: 43.9 % (ref 39.0–52.0)
Hemoglobin: 14.3 g/dL (ref 13.0–17.0)
MCH: 31.2 pg (ref 26.0–34.0)
MCHC: 32.6 g/dL (ref 30.0–36.0)
MCV: 95.9 fL (ref 78.0–100.0)
PLATELETS: 239 10*3/uL (ref 150–400)
RBC: 4.58 MIL/uL (ref 4.22–5.81)
RDW: 14 % (ref 11.5–15.5)
WBC: 5.7 10*3/uL (ref 4.0–10.5)

## 2017-09-05 LAB — BASIC METABOLIC PANEL
Anion gap: 9 (ref 5–15)
BUN: 15 mg/dL (ref 6–20)
CO2: 25 mmol/L (ref 22–32)
CREATININE: 1.16 mg/dL (ref 0.61–1.24)
Calcium: 9.4 mg/dL (ref 8.9–10.3)
Chloride: 105 mmol/L (ref 101–111)
GFR calc non Af Amer: >60 mL/min (ref >60)
Glucose, Bld: 96 mg/dL (ref 65–99)
Potassium: 4.2 mmol/L (ref 3.5–5.1)
Sodium: 139 mmol/L (ref 135–145)

## 2017-09-05 LAB — SURGICAL PCR SCREEN
MRSA, PCR: NEGATIVE
Staphylococcus aureus: NEGATIVE

## 2017-09-05 LAB — GLUCOSE, CAPILLARY: GLUCOSE-CAPILLARY: 86 mg/dL (ref 65–99)

## 2017-09-05 NOTE — Progress Notes (Signed)
PCP - Dr. Merlyn AlbertFred Wilson/ PA Victorino DikeJennifer Couillard  Cardiologist - Denies   Chest x-ray - Denies  EKG - 07/28/17- (CE)-Requested  Stress Test - 12 yrs ago- Negative  ECHO - 12 yrs ago-Negative  Cardiac Cath - Denies  Sleep Study - Yes-Positive CPAP - Yes- Told to bring mask and tubing DOS  LABS- 09/05/17: CBC, BMP  HA1C- 07/28/17: 5.9 Fasting Blood Sugar - Today, 86 Checks Blood Sugar ___0__ times a day- Pt sts he does not have a meter, and his A1C is checked twice a year by the PCP.  Pt also told to continue Aspirin, per Dr. Dion SaucierLandau.  Anesthesia- No  Pt denies having chest pain, sob, or fever at this time. All instructions explained to the pt, with a verbal understanding of the material. Pt agrees to go over the instructions while at home for a better understanding. The opportunity to ask questions was provided.

## 2017-09-11 ENCOUNTER — Other Ambulatory Visit: Payer: Self-pay | Admitting: Orthopedic Surgery

## 2017-09-11 NOTE — Care Plan (Signed)
Spoke with patient prior to surgery. He has no preference for HHPT or OPPT and choice was offered. He is anticipating discharge to home with [facility] and transition[facility] to [facility] at [facility] in [place] as it it close to his home. His OPPT will start on 09/29/17 @ 1100. His follow appointment with Dr. Dion SaucierLandau in scheduled for 09/29/17 @ 830.   Please contact Shauna HughRenee Angiulli, RNCM at [phone] with any questions or if this plan needs to change.   Thank you

## 2017-09-12 DIAGNOSIS — M1711 Unilateral primary osteoarthritis, right knee: Secondary | ICD-10-CM | POA: Insufficient documentation

## 2017-09-15 ENCOUNTER — Other Ambulatory Visit: Payer: Self-pay | Admitting: Orthopedic Surgery

## 2017-09-15 NOTE — Anesthesia Preprocedure Evaluation (Signed)
Anesthesia Evaluation  Patient identified by MRN, date of birth, ID band Patient awake    Reviewed: Allergy & Precautions, NPO status , Patient's Chart, lab work & pertinent test results  Airway Mallampati: II  TM Distance: >3 FB Neck ROM: Full    Dental no notable dental hx.    Pulmonary sleep apnea and Continuous Positive Airway Pressure Ventilation ,    Pulmonary exam normal breath sounds clear to auscultation       Cardiovascular hypertension, Pt. on medications Normal cardiovascular exam Rhythm:Regular Rate:Normal     Neuro/Psych negative neurological ROS  negative psych ROS   GI/Hepatic Neg liver ROS, GERD  ,  Endo/Other  diabetes, Type 2, Oral Hypoglycemic Agents  Renal/GU negative Renal ROS  negative genitourinary   Musculoskeletal  (+) Arthritis , Osteoarthritis,    Abdominal   Peds negative pediatric ROS (+)  Hematology negative hematology ROS (+)   Anesthesia Other Findings   Reproductive/Obstetrics negative OB ROS                             Anesthesia Physical Anesthesia Plan  ASA: III  Anesthesia Plan: Spinal   Post-op Pain Management:    Induction:   PONV Risk Score and Plan: 1 and Treatment may vary due to age or medical condition and Ondansetron  Airway Management Planned: Nasal Cannula and Natural Airway  Additional Equipment:   Intra-op Plan:   Post-operative Plan:   Informed Consent: I have reviewed the patients History and Physical, chart, labs and discussed the procedure including the risks, benefits and alternatives for the proposed anesthesia with the patient or authorized representative who has indicated his/her understanding and acceptance.     Plan Discussed with: CRNA, Anesthesiologist and Surgeon  Anesthesia Plan Comments: (  )        Anesthesia Quick Evaluation

## 2017-09-16 ENCOUNTER — Inpatient Hospital Stay (HOSPITAL_COMMUNITY): Payer: Medicare Other | Admitting: Anesthesiology

## 2017-09-16 ENCOUNTER — Inpatient Hospital Stay (HOSPITAL_COMMUNITY): Payer: Medicare Other

## 2017-09-16 ENCOUNTER — Encounter (HOSPITAL_COMMUNITY)
Admission: RE | Disposition: A | Discharge status: Home / Self Care | Payer: Self-pay | Source: Ambulatory Visit | Attending: Orthopedic Surgery

## 2017-09-16 ENCOUNTER — Encounter (HOSPITAL_COMMUNITY): Payer: Self-pay | Admitting: Certified Registered"

## 2017-09-16 ENCOUNTER — Inpatient Hospital Stay (HOSPITAL_COMMUNITY)
Admission: RE | Admit: 2017-09-16 | Discharge: 2017-09-18 | DRG: 470 | Disposition: A | Discharge status: Home / Self Care | Payer: Medicare Other | Source: Ambulatory Visit | Attending: Orthopedic Surgery | Admitting: Orthopedic Surgery

## 2017-09-16 DIAGNOSIS — M1611 Unilateral primary osteoarthritis, right hip: Secondary | ICD-10-CM | POA: Diagnosis present

## 2017-09-16 DIAGNOSIS — E119 Type 2 diabetes mellitus without complications: Secondary | ICD-10-CM | POA: Diagnosis present

## 2017-09-16 DIAGNOSIS — Z807 Family history of other malignant neoplasms of lymphoid, hematopoietic and related tissues: Secondary | ICD-10-CM | POA: Diagnosis not present

## 2017-09-16 DIAGNOSIS — K219 Gastro-esophageal reflux disease without esophagitis: Secondary | ICD-10-CM | POA: Diagnosis present

## 2017-09-16 DIAGNOSIS — E669 Obesity, unspecified: Secondary | ICD-10-CM | POA: Diagnosis present

## 2017-09-16 DIAGNOSIS — R42 Dizziness and giddiness: Secondary | ICD-10-CM | POA: Diagnosis not present

## 2017-09-16 DIAGNOSIS — M1711 Unilateral primary osteoarthritis, right knee: Secondary | ICD-10-CM | POA: Diagnosis present

## 2017-09-16 DIAGNOSIS — Z6832 Body mass index (BMI) 32.0-32.9, adult: Secondary | ICD-10-CM

## 2017-09-16 DIAGNOSIS — M109 Gout, unspecified: Secondary | ICD-10-CM | POA: Diagnosis present

## 2017-09-16 DIAGNOSIS — G4730 Sleep apnea, unspecified: Secondary | ICD-10-CM | POA: Diagnosis present

## 2017-09-16 DIAGNOSIS — M25751 Osteophyte, right hip: Secondary | ICD-10-CM | POA: Diagnosis present

## 2017-09-16 DIAGNOSIS — Z8249 Family history of ischemic heart disease and other diseases of the circulatory system: Secondary | ICD-10-CM

## 2017-09-16 DIAGNOSIS — Z79899 Other long term (current) drug therapy: Secondary | ICD-10-CM | POA: Diagnosis not present

## 2017-09-16 DIAGNOSIS — I10 Essential (primary) hypertension: Secondary | ICD-10-CM | POA: Diagnosis present

## 2017-09-16 DIAGNOSIS — Z7984 Long term (current) use of oral hypoglycemic drugs: Secondary | ICD-10-CM | POA: Diagnosis not present

## 2017-09-16 DIAGNOSIS — Z7982 Long term (current) use of aspirin: Secondary | ICD-10-CM

## 2017-09-16 DIAGNOSIS — Z96649 Presence of unspecified artificial hip joint: Secondary | ICD-10-CM

## 2017-09-16 HISTORY — DX: Unilateral primary osteoarthritis, right hip: M16.11

## 2017-09-16 HISTORY — PX: TOTAL HIP ARTHROPLASTY: SHX124

## 2017-09-16 LAB — GLUCOSE, CAPILLARY
GLUCOSE-CAPILLARY: 126 mg/dL — AB (ref 65–99)
GLUCOSE-CAPILLARY: 147 mg/dL — AB (ref 65–99)
Glucose-Capillary: 93 mg/dL (ref 65–99)

## 2017-09-16 LAB — HEMOGLOBIN AND HEMATOCRIT, BLOOD
HEMATOCRIT: 38.8 % — AB (ref 39.0–52.0)
Hemoglobin: 12.3 g/dL — ABNORMAL LOW (ref 13.0–17.0)

## 2017-09-16 SURGERY — ARTHROPLASTY, HIP, TOTAL,POSTERIOR APPROACH
Anesthesia: Spinal | Site: Hip | Laterality: Right

## 2017-09-16 MED ORDER — DEXAMETHASONE SODIUM PHOSPHATE 10 MG/ML IJ SOLN
10.0000 mg | Freq: Once | INTRAMUSCULAR | Status: AC
  Administered 2017-09-17: 10 mg via INTRAVENOUS
  Filled 2017-09-16: qty 1

## 2017-09-16 MED ORDER — BUPIVACAINE HCL (PF) 0.25 % IJ SOLN
INTRAMUSCULAR | Status: DC | PRN
  Administered 2017-09-16: 20 mL

## 2017-09-16 MED ORDER — ACETAMINOPHEN 325 MG PO TABS
325.0000 mg | ORAL_TABLET | Freq: Four times a day (QID) | ORAL | Status: DC | PRN
  Administered 2017-09-17: 650 mg via ORAL
  Filled 2017-09-16: qty 2

## 2017-09-16 MED ORDER — 0.9 % SODIUM CHLORIDE (POUR BTL) OPTIME
TOPICAL | Status: DC | PRN
  Administered 2017-09-16 (×2): 1000 mL

## 2017-09-16 MED ORDER — METFORMIN HCL 500 MG PO TABS
500.0000 mg | ORAL_TABLET | Freq: Every day | ORAL | Status: DC
  Administered 2017-09-17 – 2017-09-18 (×2): 500 mg via ORAL
  Filled 2017-09-16 (×2): qty 1

## 2017-09-16 MED ORDER — ZOLPIDEM TARTRATE 5 MG PO TABS: 5.0000 mg | ORAL_TABLET | Freq: Every evening | ORAL | Status: DC | PRN

## 2017-09-16 MED ORDER — PHENYLEPHRINE 40 MCG/ML (10ML) SYRINGE FOR IV PUSH (FOR BLOOD PRESSURE SUPPORT)
PREFILLED_SYRINGE | INTRAVENOUS | Status: AC
  Filled 2017-09-16: qty 10

## 2017-09-16 MED ORDER — ONDANSETRON HCL 4 MG PO TABS: 4.0000 mg | ORAL_TABLET | Freq: Four times a day (QID) | ORAL | Status: DC | PRN

## 2017-09-16 MED ORDER — PHENYLEPHRINE HCL 10 MG/ML IJ SOLN
INTRAVENOUS | Status: DC | PRN
  Administered 2017-09-16: 40 ug/min via INTRAVENOUS
  Administered 2017-09-16: 20 ug/min via INTRAVENOUS

## 2017-09-16 MED ORDER — METOCLOPRAMIDE HCL 5 MG/ML IJ SOLN: 5.0000 mg | Freq: Three times a day (TID) | INTRAMUSCULAR | Status: DC | PRN

## 2017-09-16 MED ORDER — FENTANYL CITRATE (PF) 100 MCG/2ML IJ SOLN: 25.0000 ug | INTRAMUSCULAR | Status: DC | PRN

## 2017-09-16 MED ORDER — PANTOPRAZOLE SODIUM 40 MG PO TBEC
40.0000 mg | DELAYED_RELEASE_TABLET | Freq: Every day | ORAL | Status: DC
  Administered 2017-09-17 – 2017-09-18 (×2): 40 mg via ORAL
  Filled 2017-09-16 (×2): qty 1

## 2017-09-16 MED ORDER — PROPOFOL 10 MG/ML IV BOLUS
INTRAVENOUS | Status: AC
  Filled 2017-09-16: qty 20

## 2017-09-16 MED ORDER — CEFAZOLIN SODIUM-DEXTROSE 2-4 GM/100ML-% IV SOLN
2.0000 g | Freq: Three times a day (TID) | INTRAVENOUS | Status: AC
  Administered 2017-09-16: 2 g via INTRAVENOUS
  Filled 2017-09-16 (×2): qty 100

## 2017-09-16 MED ORDER — BISACODYL 10 MG RE SUPP: 10.0000 mg | Freq: Every day | RECTAL | Status: DC | PRN

## 2017-09-16 MED ORDER — BENAZEPRIL HCL 20 MG PO TABS
40.0000 mg | ORAL_TABLET | Freq: Every day | ORAL | Status: DC
  Administered 2017-09-16 – 2017-09-17 (×2): 40 mg via ORAL
  Filled 2017-09-16 (×2): qty 2

## 2017-09-16 MED ORDER — EPHEDRINE 5 MG/ML INJ
INTRAVENOUS | Status: AC
  Filled 2017-09-16: qty 10

## 2017-09-16 MED ORDER — ONDANSETRON HCL 4 MG PO TABS: 4.0000 mg | ORAL_TABLET | Freq: Three times a day (TID) | ORAL | 0 refills | Status: DC | PRN

## 2017-09-16 MED ORDER — FIBER ADULT GUMMIES 2 G PO CHEW: CHEWABLE_TABLET | Freq: Every day | ORAL | Status: DC

## 2017-09-16 MED ORDER — DIPHENHYDRAMINE HCL 12.5 MG/5ML PO ELIX: 12.5000 mg | ORAL_SOLUTION | ORAL | Status: DC | PRN

## 2017-09-16 MED ORDER — PROPOFOL 500 MG/50ML IV EMUL
INTRAVENOUS | Status: DC | PRN
  Administered 2017-09-16: 50 ug/kg/min via INTRAVENOUS

## 2017-09-16 MED ORDER — CHLORHEXIDINE GLUCONATE 4 % EX LIQD: 60.0000 mL | Freq: Once | CUTANEOUS | Status: DC

## 2017-09-16 MED ORDER — METOCLOPRAMIDE HCL 5 MG PO TABS: 5.0000 mg | ORAL_TABLET | Freq: Three times a day (TID) | ORAL | Status: DC | PRN

## 2017-09-16 MED ORDER — MORPHINE SULFATE (PF) 2 MG/ML IV SOLN: 0.5000 mg | INTRAVENOUS | Status: DC | PRN

## 2017-09-16 MED ORDER — MIDAZOLAM HCL 2 MG/2ML IJ SOLN
INTRAMUSCULAR | Status: AC
  Filled 2017-09-16: qty 2

## 2017-09-16 MED ORDER — PHENOL 1.4 % MT LIQD: 1.0000 | OROMUCOSAL | Status: DC | PRN

## 2017-09-16 MED ORDER — RIVAROXABAN 10 MG PO TABS
10.0000 mg | ORAL_TABLET | Freq: Every day | ORAL | Status: DC
  Administered 2017-09-17 – 2017-09-18 (×2): 10 mg via ORAL
  Filled 2017-09-16 (×2): qty 1

## 2017-09-16 MED ORDER — FLUTICASONE PROPIONATE 50 MCG/ACT NA SUSP
1.0000 | Freq: Every day | NASAL | Status: DC
  Administered 2017-09-17 – 2017-09-18 (×2): 1 via NASAL
  Filled 2017-09-16: qty 16

## 2017-09-16 MED ORDER — METHOCARBAMOL 1000 MG/10ML IJ SOLN
500.0000 mg | Freq: Four times a day (QID) | INTRAVENOUS | Status: DC | PRN
  Filled 2017-09-16: qty 5

## 2017-09-16 MED ORDER — HYDROCODONE-ACETAMINOPHEN 7.5-325 MG PO TABS
1.0000 | ORAL_TABLET | ORAL | Status: DC | PRN
  Administered 2017-09-17: 1 via ORAL
  Filled 2017-09-16: qty 1

## 2017-09-16 MED ORDER — DEXAMETHASONE SODIUM PHOSPHATE 10 MG/ML IJ SOLN
INTRAMUSCULAR | Status: DC | PRN
  Administered 2017-09-16: 5 mg via INTRAVENOUS

## 2017-09-16 MED ORDER — POLYETHYLENE GLYCOL 3350 17 G PO PACK: 17.0000 g | PACK | Freq: Every day | ORAL | Status: DC | PRN

## 2017-09-16 MED ORDER — RIVAROXABAN 10 MG PO TABS: 10.0000 mg | ORAL_TABLET | Freq: Every day | ORAL | 0 refills | Status: DC

## 2017-09-16 MED ORDER — BUPIVACAINE IN DEXTROSE 0.75-8.25 % IT SOLN
INTRATHECAL | Status: DC | PRN
  Administered 2017-09-16: 2 mL via INTRATHECAL

## 2017-09-16 MED ORDER — LORATADINE 10 MG PO TABS
10.0000 mg | ORAL_TABLET | Freq: Every evening | ORAL | Status: DC
  Administered 2017-09-16 – 2017-09-17 (×2): 10 mg via ORAL
  Filled 2017-09-16 (×2): qty 1

## 2017-09-16 MED ORDER — ALUM & MAG HYDROXIDE-SIMETH 200-200-20 MG/5ML PO SUSP: 30.0000 mL | ORAL | Status: DC | PRN

## 2017-09-16 MED ORDER — SODIUM CHLORIDE 0.9 % IV BOLUS
500.0000 mL | Freq: Once | INTRAVENOUS | Status: AC
  Administered 2017-09-16: 500 mL via INTRAVENOUS

## 2017-09-16 MED ORDER — POTASSIUM CHLORIDE IN NACL 20-0.45 MEQ/L-% IV SOLN
INTRAVENOUS | Status: DC
  Administered 2017-09-16 – 2017-09-17 (×3): via INTRAVENOUS
  Filled 2017-09-16 (×4): qty 1000

## 2017-09-16 MED ORDER — ACETAMINOPHEN 500 MG PO TABS
500.0000 mg | ORAL_TABLET | Freq: Four times a day (QID) | ORAL | Status: AC
  Administered 2017-09-16 – 2017-09-17 (×4): 500 mg via ORAL
  Filled 2017-09-16 (×4): qty 1

## 2017-09-16 MED ORDER — MIDAZOLAM HCL 5 MG/5ML IJ SOLN
INTRAMUSCULAR | Status: DC | PRN
  Administered 2017-09-16 (×2): 1 mg via INTRAVENOUS

## 2017-09-16 MED ORDER — BUPIVACAINE HCL (PF) 0.25 % IJ SOLN
INTRAMUSCULAR | Status: AC
  Filled 2017-09-16: qty 30

## 2017-09-16 MED ORDER — EPHEDRINE SULFATE-NACL 50-0.9 MG/10ML-% IV SOSY
PREFILLED_SYRINGE | INTRAVENOUS | Status: DC | PRN
  Administered 2017-09-16 (×2): 5 mg via INTRAVENOUS
  Administered 2017-09-16: 10 mg via INTRAVENOUS
  Administered 2017-09-16: 5 mg via INTRAVENOUS

## 2017-09-16 MED ORDER — MAGNESIUM CITRATE PO SOLN: 1.0000 | Freq: Once | ORAL | Status: DC | PRN

## 2017-09-16 MED ORDER — LACTATED RINGERS IV SOLN
INTRAVENOUS | Status: DC | PRN
  Administered 2017-09-16: 07:00:00 via INTRAVENOUS

## 2017-09-16 MED ORDER — LEVOCETIRIZINE DIHYDROCHLORIDE 5 MG PO TABS: 5.0000 mg | ORAL_TABLET | Freq: Every evening | ORAL | Status: DC

## 2017-09-16 MED ORDER — DEXAMETHASONE SODIUM PHOSPHATE 10 MG/ML IJ SOLN
INTRAMUSCULAR | Status: AC
  Filled 2017-09-16: qty 1

## 2017-09-16 MED ORDER — MENTHOL 3 MG MT LOZG: 1.0000 | LOZENGE | OROMUCOSAL | Status: DC | PRN

## 2017-09-16 MED ORDER — KETOTIFEN FUMARATE 0.025 % OP SOLN
1.0000 [drp] | Freq: Two times a day (BID) | OPHTHALMIC | Status: DC
  Administered 2017-09-16 – 2017-09-18 (×3): 1 [drp] via OPHTHALMIC
  Filled 2017-09-16: qty 5

## 2017-09-16 MED ORDER — FENTANYL CITRATE (PF) 100 MCG/2ML IJ SOLN
INTRAMUSCULAR | Status: DC | PRN
  Administered 2017-09-16: 50 ug via INTRAVENOUS

## 2017-09-16 MED ORDER — SENNA-DOCUSATE SODIUM 8.6-50 MG PO TABS: 2.0000 | ORAL_TABLET | Freq: Every day | ORAL | 1 refills | Status: DC

## 2017-09-16 MED ORDER — HYDROCODONE-ACETAMINOPHEN 5-325 MG PO TABS: 1.0000 | ORAL_TABLET | ORAL | Status: DC | PRN

## 2017-09-16 MED ORDER — ONDANSETRON HCL 4 MG/2ML IJ SOLN
INTRAMUSCULAR | Status: AC
  Filled 2017-09-16: qty 2

## 2017-09-16 MED ORDER — HYDROCODONE-ACETAMINOPHEN 5-325 MG PO TABS: 1.0000 | ORAL_TABLET | Freq: Four times a day (QID) | ORAL | 0 refills | Status: DC | PRN

## 2017-09-16 MED ORDER — AZELASTINE HCL 0.1 % NA SOLN
1.0000 | Freq: Two times a day (BID) | NASAL | Status: DC
  Administered 2017-09-17 – 2017-09-18 (×3): 1 via NASAL
  Filled 2017-09-16: qty 30

## 2017-09-16 MED ORDER — ALLOPURINOL 300 MG PO TABS
300.0000 mg | ORAL_TABLET | Freq: Every day | ORAL | Status: DC
  Administered 2017-09-17 – 2017-09-18 (×2): 300 mg via ORAL
  Filled 2017-09-16 (×2): qty 1

## 2017-09-16 MED ORDER — PHENYLEPHRINE 40 MCG/ML (10ML) SYRINGE FOR IV PUSH (FOR BLOOD PRESSURE SUPPORT)
PREFILLED_SYRINGE | INTRAVENOUS | Status: DC | PRN
  Administered 2017-09-16: 40 ug via INTRAVENOUS

## 2017-09-16 MED ORDER — CEFAZOLIN SODIUM-DEXTROSE 2-4 GM/100ML-% IV SOLN
2.0000 g | INTRAVENOUS | Status: AC
  Administered 2017-09-16: 2 g via INTRAVENOUS
  Filled 2017-09-16: qty 100

## 2017-09-16 MED ORDER — ONDANSETRON HCL 4 MG/2ML IJ SOLN: 4.0000 mg | Freq: Four times a day (QID) | INTRAMUSCULAR | Status: DC | PRN

## 2017-09-16 MED ORDER — ONDANSETRON HCL 4 MG/2ML IJ SOLN
INTRAMUSCULAR | Status: DC | PRN
  Administered 2017-09-16: 4 mg via INTRAVENOUS

## 2017-09-16 MED ORDER — FENTANYL CITRATE (PF) 250 MCG/5ML IJ SOLN
INTRAMUSCULAR | Status: AC
  Filled 2017-09-16: qty 5

## 2017-09-16 MED ORDER — ROSUVASTATIN CALCIUM 10 MG PO TABS: 10.0000 mg | ORAL_TABLET | ORAL | Status: DC

## 2017-09-16 MED ORDER — METHOCARBAMOL 500 MG PO TABS
500.0000 mg | ORAL_TABLET | Freq: Four times a day (QID) | ORAL | Status: DC | PRN
  Administered 2017-09-16 – 2017-09-17 (×2): 500 mg via ORAL
  Filled 2017-09-16 (×2): qty 1

## 2017-09-16 MED ORDER — BACLOFEN 10 MG PO TABS: 10.0000 mg | ORAL_TABLET | Freq: Three times a day (TID) | ORAL | 0 refills | Status: DC

## 2017-09-16 MED ORDER — KETOROLAC TROMETHAMINE 15 MG/ML IJ SOLN
7.5000 mg | Freq: Four times a day (QID) | INTRAMUSCULAR | Status: AC
  Administered 2017-09-16 – 2017-09-17 (×4): 7.5 mg via INTRAVENOUS
  Filled 2017-09-16 (×4): qty 1

## 2017-09-16 MED ORDER — AMLODIPINE BESYLATE 5 MG PO TABS
5.0000 mg | ORAL_TABLET | Freq: Every day | ORAL | Status: DC
  Administered 2017-09-16 – 2017-09-17 (×2): 5 mg via ORAL
  Filled 2017-09-16 (×2): qty 1

## 2017-09-16 MED ORDER — ROSUVASTATIN CALCIUM 10 MG PO TABS
10.0000 mg | ORAL_TABLET | ORAL | Status: DC
  Administered 2017-09-17: 10 mg via ORAL
  Filled 2017-09-16: qty 1

## 2017-09-16 MED ORDER — MONTELUKAST SODIUM 10 MG PO TABS
10.0000 mg | ORAL_TABLET | Freq: Every day | ORAL | Status: DC
  Administered 2017-09-16 – 2017-09-17 (×2): 10 mg via ORAL
  Filled 2017-09-16 (×2): qty 1

## 2017-09-16 MED ORDER — DOCUSATE SODIUM 100 MG PO CAPS
100.0000 mg | ORAL_CAPSULE | Freq: Two times a day (BID) | ORAL | Status: DC
  Administered 2017-09-16 – 2017-09-18 (×5): 100 mg via ORAL
  Filled 2017-09-16 (×5): qty 1

## 2017-09-16 MED ORDER — MEPERIDINE HCL 50 MG/ML IJ SOLN: 6.2500 mg | INTRAMUSCULAR | Status: DC | PRN

## 2017-09-16 SURGICAL SUPPLY — 50 items
BIT DRILL 5/64X5 DISP (BIT) ×2 IMPLANT
BLADE SAW SGTL 73X25 THK (BLADE) ×2 IMPLANT
CAPT HIP TOTAL 2 ×2 IMPLANT
CLSR STERI-STRIP ANTIMIC 1/2X4 (GAUZE/BANDAGES/DRESSINGS) ×4 IMPLANT
COVER SURGICAL LIGHT HANDLE (MISCELLANEOUS) ×2 IMPLANT
DRAPE INCISE IOBAN 66X45 STRL (DRAPES) ×2 IMPLANT
DRAPE ORTHO SPLIT 77X108 STRL (DRAPES) ×2
DRAPE SURG ORHT 6 SPLT 77X108 (DRAPES) ×2 IMPLANT
DRAPE U-SHAPE 47X51 STRL (DRAPES) ×2 IMPLANT
DRSG MEPILEX BORDER 4X12 (GAUZE/BANDAGES/DRESSINGS) IMPLANT
DRSG MEPILEX BORDER 4X8 (GAUZE/BANDAGES/DRESSINGS) ×2 IMPLANT
DURAPREP 26ML APPLICATOR (WOUND CARE) ×4 IMPLANT
ELECT BLADE 4.0 EZ CLEAN MEGAD (MISCELLANEOUS) ×2
ELECT CAUTERY BLADE 6.4 (BLADE) ×2 IMPLANT
ELECT REM PT RETURN 9FT ADLT (ELECTROSURGICAL) ×2
ELECTRODE BLDE 4.0 EZ CLN MEGD (MISCELLANEOUS) ×1 IMPLANT
ELECTRODE REM PT RTRN 9FT ADLT (ELECTROSURGICAL) ×1 IMPLANT
GLOVE BIOGEL PI ORTHO PRO SZ8 (GLOVE) ×2
GLOVE ORTHO TXT STRL SZ7.5 (GLOVE) ×2 IMPLANT
GLOVE PI ORTHO PRO STRL SZ8 (GLOVE) ×2 IMPLANT
GLOVE SURG ORTHO 8.0 STRL STRW (GLOVE) ×2 IMPLANT
GOWN STRL REUS W/ TWL XL LVL3 (GOWN DISPOSABLE) ×1 IMPLANT
GOWN STRL REUS W/TWL 2XL LVL3 (GOWN DISPOSABLE) ×2 IMPLANT
GOWN STRL REUS W/TWL XL LVL3 (GOWN DISPOSABLE) ×1
HOOD PEEL AWAY FACE SHEILD DIS (HOOD) ×4 IMPLANT
HOOD PEEL AWAY FLYTE STAYCOOL (MISCELLANEOUS) ×2 IMPLANT
KIT BASIN OR (CUSTOM PROCEDURE TRAY) ×2 IMPLANT
KIT ROOM TURNOVER OR (KITS) ×2 IMPLANT
MANIFOLD NEPTUNE II (INSTRUMENTS) ×2 IMPLANT
NDL SAFETY ECLIPSE 18X1.5 (NEEDLE) ×1 IMPLANT
NEEDLE HYPO 18GX1.5 SHARP (NEEDLE) ×1
NS IRRIG 1000ML POUR BTL (IV SOLUTION) ×2 IMPLANT
PACK TOTAL JOINT (CUSTOM PROCEDURE TRAY) ×2 IMPLANT
PAD ARMBOARD 7.5X6 YLW CONV (MISCELLANEOUS) ×4 IMPLANT
PILLOW ABDUCTION HIP (SOFTGOODS) ×2 IMPLANT
PRESSURIZER FEMORAL UNIV (MISCELLANEOUS) IMPLANT
RETRIEVER SUT HEWSON (MISCELLANEOUS) ×2 IMPLANT
SUCTION FRAZIER HANDLE 10FR (MISCELLANEOUS) ×1
SUCTION TUBE FRAZIER 10FR DISP (MISCELLANEOUS) ×1 IMPLANT
SUT FIBERWIRE #2 38 REV NDL BL (SUTURE) ×6
SUT VIC AB 0 CT1 27 (SUTURE) ×2
SUT VIC AB 0 CT1 27XBRD ANBCTR (SUTURE) ×2 IMPLANT
SUT VIC AB 2-0 CT1 27 (SUTURE) ×2
SUT VIC AB 2-0 CT1 TAPERPNT 27 (SUTURE) ×2 IMPLANT
SUT VIC AB 3-0 SH 8-18 (SUTURE) ×2 IMPLANT
SUTURE FIBERWR#2 38 REV NDL BL (SUTURE) ×3 IMPLANT
SYR BULB IRRIGATION 50ML (SYRINGE) ×2 IMPLANT
SYR CONTROL 10ML LL (SYRINGE) ×2 IMPLANT
TOWEL OR 17X24 6PK STRL BLUE (TOWEL DISPOSABLE) ×2 IMPLANT
TOWEL OR 17X26 10 PK STRL BLUE (TOWEL DISPOSABLE) ×2 IMPLANT

## 2017-09-16 NOTE — Anesthesia Procedure Notes (Signed)
Spinal  Patient location during procedure: OR Start time: 09/16/2017 7:40 AM End time: 09/16/2017 7:45 AM Staffing Anesthesiologist: Bethena Midgetddono, Billyjack Trompeter, MD Preanesthetic Checklist Completed: patient identified, site marked, surgical consent, pre-op evaluation, timeout performed, IV checked, risks and benefits discussed and monitors and equipment checked Spinal Block Patient position: sitting Prep: DuraPrep Patient monitoring: heart rate, cardiac monitor, continuous pulse ox and blood pressure Approach: midline Location: L4-5 Injection technique: single-shot Needle Needle type: Sprotte  Needle gauge: 24 G Needle length: 9 cm Assessment Sensory level: T4

## 2017-09-16 NOTE — Progress Notes (Signed)
Orthopedic Tech Progress Note Patient Details:  Jeffery Friedman September 02, 1945 [id number]  Ortho Devices Type of Ortho Device: Knee Immobilizer Ortho Device/Splint Location: rle Ortho Device/Splint Interventions: Application   Post Interventions Patient Tolerated: Well Instructions Provided: Care of device Trapeze bar patient helper  [name], Karalyne Nusser 09/16/2017, 1:59 PM

## 2017-09-16 NOTE — Transfer of Care (Signed)
Immediate Anesthesia Transfer of Care Note  Patient: Jeffery Friedman  Procedure(s) Performed: TOTAL HIP ARTHROPLASTY (Right Hip)  Patient Location: PACU  Anesthesia Type:Spinal  Level of Consciousness: awake, alert  and oriented  Airway & Oxygen Therapy: Patient Spontanous Breathing  Post-op Assessment: Report given to RN and Post -op Vital signs reviewed and stable  Post vital signs: Reviewed and stable  Last Vitals:  Vitals Value Taken Time  BP 118/95 09/16/2017 10:03 AM  Temp    Pulse 72 09/16/2017 10:05 AM  Resp 21 09/16/2017 10:05 AM  SpO2 93 % 09/16/2017 10:05 AM  Vitals shown include unvalidated device data.  Last Pain:  Vitals:   09/16/17 0654  TempSrc:   PainSc: 6          Complications: No apparent anesthesia complications

## 2017-09-16 NOTE — Evaluation (Addendum)
Physical Therapy Evaluation Patient Details Name: Jeffery Friedman MRN: [id number] DOB: October 03, 1945 Today's Date: 09/16/2017   History of Present Illness  Pt is a 72 y/o male s/p elective R THA, posterior approach. PMH includes DM, HTN, gout, and OSA on CPAP.   Clinical Impression  Pt s/p surgery above with deficits below. Pt tolerated mobility to chair well this session with min to min guard A. Required cues throughout for maintenance of posterior hip precautions. Reviewed supine HEP and hip precautions with pt. Will continue to follow acutely to maximize functional mobility independence and safety.     Follow Up Recommendations Follow surgeon's recommendation for DC plan and follow-up therapies;Supervision for mobility/OOB    Equipment Recommendations  Rolling walker with 5" wheels;3in1 (PT)    Recommendations for Other Services OT consult     Precautions / Restrictions Precautions Precautions: Posterior Hip Precaution Booklet Issued: Yes (comment) Precaution Comments: Reviewed hip precautions and supine ther ex.  Required Braces or Orthoses: Knee Immobilizer - Right Knee Immobilizer - Right: Other (comment)(when in the bed) Restrictions Weight Bearing Restrictions: Yes RLE Weight Bearing: Weight bearing as tolerated      Mobility  Bed Mobility Overal bed mobility: Needs Assistance Bed Mobility: Supine to Sit     Supine to sit: Min assist     General bed mobility comments: Min A for RLE management. Verbal cues to maintain hip precautions throughout and appropriate technique to use.   Transfers Overall transfer level: Needs assistance Equipment used: Rolling walker (2 wheeled) Transfers: Sit to/from [employer]Stand;Stand Pivot Transfers Sit to Stand: Min assist;From elevated surface Stand pivot transfers: Min guard       General transfer comment: Min A from elevated surface to stand at EOB. Able to stand without UE support to perform toleting tasks. Performed stand pivot  transfer to chair with min guard A. Required cues for sequencing and to maintain hip precautions. Pt reports orthostatic symptoms earlier in the day, so mobility limited to chair for safety this session. Pt asymptomatic throughout.   Ambulation/Gait                Stairs            Wheelchair Mobility    Modified Rankin (Stroke Patients Only)       Balance Overall balance assessment: Needs assistance Sitting-balance support: No upper extremity supported;Feet supported Sitting balance-Leahy Scale: Good     Standing balance support: Bilateral upper extremity supported;No upper extremity supported;During functional activity Standing balance-Leahy Scale: Fair Standing balance comment: Able to maintain static standing without UE support for toileting tasks.                              Pertinent Vitals/Pain Pain Assessment: 0-10 Pain Score: 2  Pain Location: R hip  Pain Descriptors / Indicators: Aching;Operative site guarding Pain Intervention(s): Limited activity within patient's tolerance;Monitored during session;Repositioned    Home Living Family/patient expects to be discharged to:: Private residence Living Arrangements: Spouse/significant other Available Help at Discharge: Family;Available 24 hours/day Type of Home: House Home Access: Stairs to enter Entrance Stairs-Rails: Right;Left;Can reach both Entrance Stairs-Number of Steps: 4 Home Layout: One level Home Equipment: Shower seat      Prior Function Level of Independence: Independent               Hand Dominance   Dominant Hand: Right    Extremity/Trunk Assessment   Upper Extremity Assessment Upper Extremity Assessment: Defer to OT  evaluation    Lower Extremity Assessment Lower Extremity Assessment: RLE deficits/detail RLE Deficits / Details: Sensory in tact. Derficits consistent with post op pain and weakness. Able to perform ther ex below.     Cervical / Trunk  Assessment Cervical / Trunk Assessment: Normal  Communication   Communication: No difficulties  Cognition Arousal/Alertness: Awake/alert Behavior During Therapy: WFL for tasks assessed/performed Overall Cognitive Status: Within Functional Limits for tasks assessed                                        General Comments General comments (skin integrity, edema, etc.): Pt's wife present during session.     Exercises Total Joint Exercises Ankle Circles/Pumps: AROM;Both;20 reps Quad Sets: AROM;Right;10 reps Heel Slides: AROM;Right;10 reps(within precautions )   Assessment/Plan    PT Assessment Patient needs continued PT services  PT Problem List Decreased strength;Decreased range of motion;Decreased balance;Decreased knowledge of use of DME;Decreased mobility;Decreased knowledge of precautions;Pain       PT Treatment Interventions DME instruction;Gait training;Stair training;Functional mobility training;Therapeutic activities;Therapeutic exercise;Balance training;Neuromuscular re-education;Patient/family education    PT Goals (Current goals can be found in the Care Plan section)  Acute Rehab PT Goals Patient Stated Goal: to go home  PT Goal Formulation: With patient Time For Goal Achievement: 09/30/17 Potential to Achieve Goals: Good    Frequency 7X/week   Barriers to discharge        Co-evaluation               AM-PAC PT "6 Clicks" Daily Activity  Outcome Measure Difficulty turning over in bed (including adjusting bedclothes, sheets and blankets)?: A Little Difficulty moving from lying on back to sitting on the side of the bed? : Unable Difficulty sitting down on and standing up from a chair with arms (e.g., wheelchair, bedside commode, etc,.)?: Unable Help needed moving to and from a bed to chair (including a wheelchair)?: A Little Help needed walking in hospital room?: A Little Help needed climbing 3-5 steps with a railing? : A Lot 6 Click Score:  13    End of Session Equipment Utilized During Treatment: Gait belt Activity Tolerance: Patient tolerated treatment well Patient left: in chair;with call bell/phone within reach;with family/visitor present Nurse Communication: Mobility status(pt voided ) PT Visit Diagnosis: Other abnormalities of gait and mobility (R26.89);Pain Pain - Right/Left: Right Pain - part of body: Hip    Time: 1535-1611 PT Time Calculation (min) (ACUTE ONLY): 36 min   Charges:   PT Evaluation $PT Eval Low Complexity: 1 Low PT Treatments $Therapeutic Activity: 8-22 mins   PT G Codes:        Gladys Damme, PT, DPT  Acute Rehabilitation Services  Pager: [phone]   Lehman Prom 09/16/2017, 6:44 PM

## 2017-09-16 NOTE — Anesthesia Postprocedure Evaluation (Signed)
Anesthesia Post Note  Patient: Rise PaganiniJames Brokaw  Procedure(s) Performed: TOTAL HIP ARTHROPLASTY (Right Hip)     Patient location during evaluation: PACU Anesthesia Type: Spinal Level of consciousness: oriented and awake and alert Pain management: pain level controlled Vital Signs Assessment: post-procedure vital signs reviewed and stable Respiratory status: spontaneous breathing, respiratory function stable and patient connected to nasal cannula oxygen Cardiovascular status: blood pressure returned to baseline and stable Postop Assessment: no headache, no backache and no apparent nausea or vomiting Anesthetic complications: no    Last Vitals:  Vitals:   09/16/17 1103 09/16/17 1130  BP: 124/85 121/78  Pulse: 65 62  Resp: (!) 9 14  Temp:  37.1 C  SpO2: 93% 95%    Last Pain:  Vitals:   09/16/17 1130  TempSrc: Oral  PainSc:                  Myan Locatelli

## 2017-09-16 NOTE — Progress Notes (Signed)
Orthopedic Tech Progress Note Patient Details:  Jeffery Friedman 1945-11-05 [id number]  Ortho Devices Type of Ortho Device: Knee Immobilizer Ortho Device/Splint Location: rle Ortho Device/Splint Interventions: Application   Post Interventions Patient Tolerated: Well Instructions Provided: Care of device   [name], Jeffery Friedman 09/16/2017, 1:53 PM

## 2017-09-16 NOTE — Discharge Instructions (Addendum)
INSTRUCTIONS AFTER JOINT REPLACEMENT  ° °o Remove items at home which could result in a fall. This includes throw rugs or furniture in walking pathways °o ICE to the affected joint every three hours while awake for 30 minutes at a time, for at least the first 3-5 days, and then as needed for pain and swelling.  Continue to use ice for pain and swelling. You may notice swelling that will progress down to the foot and ankle.  This is normal after surgery.  Elevate your leg when you are not up walking on it.   °o Continue to use the breathing machine you got in the hospital (incentive spirometer) which will help keep your temperature down.  It is common for your temperature to cycle up and down following surgery, especially at night when you are not up moving around and exerting yourself.  The breathing machine keeps your lungs expanded and your temperature down. ° ° °DIET:  As you were doing prior to hospitalization, we recommend a well-balanced diet. ° °DRESSING / WOUND CARE / SHOWERING ° °You may change your dressing 3-5 days after surgery.  Then change the dressing every day with sterile gauze.  Please use good hand washing techniques before changing the dressing.  Do not use any lotions or creams on the incision until instructed by your surgeon. ° °ACTIVITY ° °o Increase activity slowly as tolerated, but follow the weight bearing instructions below.   °o No driving for 6 weeks or until further direction given by your physician.  You cannot drive while taking narcotics.  °o No lifting or carrying greater than 10 lbs. until further directed by your surgeon. °o Avoid periods of inactivity such as sitting longer than an hour when not asleep. This helps prevent blood clots.  °o You may return to work once you are authorized by your doctor.  ° ° ° °WEIGHT BEARING  ° °Weight bearing as tolerated with assist device (walker, cane, etc) as directed, use it as long as suggested by your surgeon or therapist, typically at  least 4-6 weeks. ° ° °EXERCISES ° °Results after joint replacement surgery are often greatly improved when you follow the exercise, range of motion and muscle strengthening exercises prescribed by your doctor. Safety measures are also important to protect the joint from further injury. Any time any of these exercises cause you to have increased pain or swelling, decrease what you are doing until you are comfortable again and then slowly increase them. If you have problems or questions, call your caregiver or physical therapist for advice.  ° °Rehabilitation is important following a joint replacement. After just a few days of immobilization, the muscles of the leg can become weakened and shrink (atrophy).  These exercises are designed to build up the tone and strength of the thigh and leg muscles and to improve motion. Often times heat used for twenty to thirty minutes before working out will loosen up your tissues and help with improving the range of motion but do not use heat for the first two weeks following surgery (sometimes heat can increase post-operative swelling).  ° °These exercises can be done on a training (exercise) mat, on the floor, on a table or on a bed. Use whatever works the best and is most comfortable for you.    Use music or television while you are exercising so that the exercises are a pleasant break in your day. This will make your life better with the exercises acting as a break   in your routine that you can look forward to.   Perform all exercises about fifteen times, three times per day or as directed.  You should exercise both the operative leg and the other leg as well. ° °Exercises include: °  °• Quad Sets - Tighten up the muscle on the front of the thigh (Quad) and hold for 5-10 seconds.   °• Straight Leg Raises - With your knee straight (if you were given a brace, keep it on), lift the leg to 60 degrees, hold for 3 seconds, and slowly lower the leg.  Perform this exercise against  resistance later as your leg gets stronger.  °• Leg Slides: Lying on your back, slowly slide your foot toward your buttocks, bending your knee up off the floor (only go as far as is comfortable). Then slowly slide your foot back down until your leg is flat on the floor again.  °• Angel Wings: Lying on your back spread your legs to the side as far apart as you can without causing discomfort.  °• Hamstring Strength:  Lying on your back, push your heel against the floor with your leg straight by tightening up the muscles of your buttocks.  Repeat, but this time bend your knee to a comfortable angle, and push your heel against the floor.  You may put a pillow under the heel to make it more comfortable if necessary.  ° °A rehabilitation program following joint replacement surgery can speed recovery and prevent re-injury in the future due to weakened muscles. Contact your doctor or a physical therapist for more information on knee rehabilitation.  ° ° °CONSTIPATION ° °Constipation is defined medically as fewer than three stools per week and severe constipation as less than one stool per week.  Even if you have a regular bowel pattern at home, your normal regimen is likely to be disrupted due to multiple reasons following surgery.  Combination of anesthesia, postoperative narcotics, change in appetite and fluid intake all can affect your bowels.  ° °YOU MUST use at least one of the following options; they are listed in order of increasing strength to get the job done.  They are all available over the counter, and you may need to use some, POSSIBLY even all of these options:   ° °Drink plenty of fluids (prune juice may be helpful) and high fiber foods °Colace 100 mg by mouth twice a day  °Senokot for constipation as directed and as needed Dulcolax (bisacodyl), take with full glass of water  °Miralax (polyethylene glycol) once or twice a day as needed. ° °If you have tried all these things and are unable to have a bowel  movement in the first 3-4 days after surgery call either your surgeon or your primary doctor.   ° °If you experience loose stools or diarrhea, hold the medications until you stool forms back up.  If your symptoms do not get better within 1 week or if they get worse, check with your doctor.  If you experience "the worst abdominal pain ever" or develop nausea or vomiting, please contact the office immediately for further recommendations for treatment. ° ° °ITCHING:  If you experience itching with your medications, try taking only a single pain pill, or even half a pain pill at a time.  You can also use Benadryl over the counter for itching or also to help with sleep.  ° °TED HOSE STOCKINGS:  Use stockings on both legs until for at least 2 weeks or as   directed by physician office. They may be removed at night for sleeping. ° °MEDICATIONS:  See your medication summary on the “After Visit Summary” that nursing will review with you.  You may have some home medications which will be placed on hold until you complete the course of blood thinner medication.  It is important for you to complete the blood thinner medication as prescribed. ° °PRECAUTIONS:  If you experience chest pain or shortness of breath - call 911 immediately for transfer to the hospital emergency department.  ° °If you develop a fever greater that 101 F, purulent drainage from wound, increased redness or drainage from wound, foul odor from the wound/dressing, or calf pain - CONTACT YOUR SURGEON.   °                                                °FOLLOW-UP APPOINTMENTS:  If you do not already have a post-op appointment, please call the office for an appointment to be seen by your surgeon.  Guidelines for how soon to be seen are listed in your “After Visit Summary”, but are typically between 1-4 weeks after surgery. ° °OTHER INSTRUCTIONS:  ° °Knee Replacement:  Do not place pillow under knee, focus on keeping the knee straight while resting. CPM  instructions: 0-90 degrees, 2 hours in the morning, 2 hours in the afternoon, and 2 hours in the evening. Place foam block, curve side up under heel at all times except when in CPM or when walking.  DO NOT modify, tear, cut, or change the foam block in any way. ° °MAKE SURE YOU:  °• Understand these instructions.  °• Get help right away if you are not doing well or get worse.  ° ° °Thank you for letting us be a part of your medical care team.  It is a privilege we respect greatly.  We hope these instructions will help you stay on track for a fast and full recovery!  ° °Information on my medicine - XARELTO® (Rivaroxaban) ° ° °Why was Xarelto® prescribed for you? °Xarelto® was prescribed for you to reduce the risk of blood clots forming after orthopedic surgery. The medical term for these abnormal blood clots is venous thromboembolism (VTE). ° °What do you need to know about xarelto® ? °Take your Xarelto® ONCE DAILY at the same time every day. °You may take it either with or without food. ° °If you have difficulty swallowing the tablet whole, you may crush it and mix in applesauce just prior to taking your dose. ° °Take Xarelto® exactly as prescribed by your doctor and DO NOT stop taking Xarelto® without talking to the doctor who prescribed the medication.  Stopping without other VTE prevention medication to take the place of Xarelto® may increase your risk of developing a clot. ° °After discharge, you should have regular check-up appointments with your healthcare provider that is prescribing your Xarelto®.   ° °What do you do if you miss a dose? °If you miss a dose, take it as soon as you remember on the same day then continue your regularly scheduled once daily regimen the next day. Do not take two doses of Xarelto® on the same day.  ° °Important Safety Information °A possible side effect of Xarelto® is bleeding. You should call your healthcare provider right away if you experience any of the following: °?   Bleeding  from an injury or your nose that does not stop. °? Unusual colored urine (red or dark brown) or unusual colored stools (red or black). °? Unusual bruising for unknown reasons. °? A serious fall or if you hit your head (even if there is no bleeding). ° °Some medicines may interact with Xarelto® and might increase your risk of bleeding while on Xarelto®. To help avoid this, consult your healthcare provider or pharmacist prior to using any new prescription or non-prescription medications, including herbals, vitamins, non-steroidal anti-inflammatory drugs (NSAIDs) and supplements. ° °This website has more information on Xarelto®: www.xarelto.com. ° ° °

## 2017-09-16 NOTE — Op Note (Signed)
09/16/2017  9:52 AM  PATIENT:  Jeffery Friedman   MRN: [id number]  PRE-OPERATIVE DIAGNOSIS:  Right hip primary localized osteoarthritis  POST-OPERATIVE DIAGNOSIS:  same  PROCEDURE:  Procedure(s): TOTAL HIP ARTHROPLASTY  PREOPERATIVE INDICATIONS:    Jeffery Friedman is an 72 y.o. male who has a diagnosis of right hip arthritis and elected for surgical management after failing conservative treatment.  The risks benefits and alternatives were discussed with the patient including but not limited to the risks of nonoperative treatment, versus surgical intervention including infection, bleeding, nerve injury, periprosthetic fracture, the need for revision surgery, dislocation, leg length discrepancy, blood clots, cardiopulmonary complications, morbidity, mortality, among others, and they were willing to proceed.     OPERATIVE REPORT     SURGEON:  Marchia Bond, MD    ASSISTANT:  Joya Gaskins, OPA-C  (Present throughout the entire procedure,  necessary for completion of procedure in a timely manner, assisting with retraction, instrumentation, and closure)     ANESTHESIA:  spinal  ESTIMATED BLOOD LOSS: 878    COMPLICATIONS:  None.     UNIQUE ASPECTS OF THE CASE:  Anterior osteophyte on the acetabulum, extreme stiffness preoperatively, almost couldn't abduct enough to prep.    COMPONENTS:  [employer] fit femur size 5 with a 36 mm +6.7 metallic head ball and a Gription Acetabular shell size 54, with a single cancellous screw for backup fixation, with an apex hole eliminator and a +4 neutral polyethylene liner.    PROCEDURE IN DETAIL:   The patient was met in the holding area and  identified.  The appropriate hip was identified and marked at the operative site.  The patient was then transported to the OR  and  placed under anesthesia.  At that point, the patient was  placed in the lateral decubitus position with the operative side up and  secured to the operating room table and all  bony prominences padded.     The operative lower extremity was prepped from the iliac crest to the distal leg.  Sterile draping was performed.  Time out was performed prior to incision.      A routine posterolateral approach was utilized via sharp dissection  carried down to the subcutaneous tissue.  Gross bleeders were Bovie coagulated.  The iliotibial band was identified and incised along the length of the skin incision.  Self-retaining retractors were  inserted.  With the hip internally rotated, the short external rotators  were identified. The piriformis and capsule was tagged with FiberWire, and the hip capsule released in a T-type fashion.  The femoral neck was exposed, and I resected the femoral neck using the appropriate jig. This was performed at approximately a thumb's breadth above the lesser trochanter.    I then exposed the deep acetabulum, cleared out any tissue including the ligamentum teres.  A wing retractor was placed.  After adequate visualization, I excised the labrum, and then sequentially reamed.  I placed the trial acetabulum, which seated nicely, and then impacted the real cup into place.  Appropriate version and inclination was confirmed clinically matching their bony anatomy, and also with the use of the jig.  I placed a cancellous screw to augment fixation.  A trial polyethylene liner was placed and the wing retractor removed.    I then prepared the proximal femur using the cookie-cutter, the lateralizing reamer, and then sequentially reamed and broached.  A trial broach, neck, and head was utilized, and I reduced the hip and it was found  to have excellent stability with functional range of motion. The trial components were then removed, and the real polyethylene liner was placed.  I then impacted the real femoral prosthesis into place into the appropriate version, and I impacted the real head ball into place. The hip was then reduced and taken through functional range of  motion and found to have excellent stability. Leg lengths were restored.  I then used a 2 mm drill bits to pass the FiberWire suture from the capsule and piriformis through the greater trochanter, and secured this. Excellent posterior capsular repair was achieved. I also closed the T in the capsule.  I then irrigated the hip copiously again with pulse lavage, and repaired the fascia with Vicryl, followed by Vicryl for the subcutaneous tissue, Monocryl for the skin, Steri-Strips and sterile gauze. The wounds were injected. The patient was then awakened and returned to PACU in stable and satisfactory condition. There were no complications.  Marchia Bond, MD Orthopedic Surgeon [phone]   09/16/2017 9:52 AM

## 2017-09-16 NOTE — H&P (Signed)
PREOPERATIVE H&P  Chief Complaint: right hip pain  HPI: Jeffery Friedman is a 72 y.o. male who presents for preoperative history and physical with a diagnosis of right hip osteoarthritis primary localized. Symptoms are rated as moderate to severe, and have been worsening.  This is significantly impairing activities of daily living.  He has elected for surgical management.   He has failed injections, activity modification, anti-inflammatories, and assistive devices.  Preoperative X-rays demonstrate end stage degenerative changes with osteophyte formation, loss of joint space, subchondral sclerosis.   Past Medical History:  Diagnosis Date  . Arthritis   . Arthritis of knee, right   . Arthritis of right hip    End Stage  . Diabetes mellitus without complication (HCC)    Type II  . GERD (gastroesophageal reflux disease)   . Gout   . Hypertension   . Sleep apnea    Uses CPAP   Past Surgical History:  Procedure Laterality Date  . APPENDECTOMY    . SHOULDER ARTHROSCOPY W/ ROTATOR CUFF REPAIR     Right  . TONSILLECTOMY     Social History   Socioeconomic History  . Marital status: Married    Spouse name: Not on file  . Number of children: Not on file  . Years of education: Not on file  . Highest education level: Not on file  Occupational History  . Not on file  Social Needs  . Financial resource strain: Not on file  . Food insecurity:    Worry: Not on file    Inability: Not on file  . Transportation needs:    Medical: Not on file    Non-medical: Not on file  Tobacco Use  . Smoking status: Never Smoker  . Smokeless tobacco: Never Used  Substance and Sexual Activity  . Alcohol use: Yes    Comment: occasional  . Drug use: No  . Sexual activity: Yes    Partners: Female  Lifestyle  . Physical activity:    Days per week: Not on file    Minutes per session: Not on file  . Stress: Not on file  Relationships  . Social connections:    Talks on phone: Not on file    Gets  together: Not on file    Attends religious service: Not on file    Active member of club or organization: Not on file    Attends meetings of clubs or organizations: Not on file    Relationship status: Not on file  Other Topics Concern  . Not on file  Social History Narrative  . Not on file   Family History  Problem Relation Age of Onset  . Lymphoma Mother   . Heart attack Father    Allergies  Allergen Reactions  . Hydrocodone-Homatropine Other (See Comments)    "loopy"   Prior to Admission medications   Medication Sig Start Date End Date Taking? Authorizing Provider  allopurinol (ZYLOPRIM) 300 MG tablet Take 300 mg by mouth daily.   Yes [provider]  amLODipine (NORVASC) 5 MG tablet Take 5 mg by mouth at bedtime.    Yes [provider]  aspirin 81 MG tablet Take 81 mg by mouth at bedtime.    Yes [provider]  azelastine (ASTELIN) 137 MCG/SPRAY nasal spray Place 1 spray into both nostrils 2 (two) times daily.    Yes [provider]  azelastine (OPTIVAR) 0.05 % ophthalmic solution Place 1 drop into both eyes 2 (two) times daily as needed (for  allergy relief).   Yes [provider]  benazepril (LOTENSIN) 40 MG tablet Take 40 mg by mouth at bedtime.    Yes [provider]  FIBER ADULT GUMMIES PO Take 2 each by mouth daily.   Yes [provider]  fluticasone (FLONASE) 50 MCG/ACT nasal spray Place 1 spray in each nostril in the morning 06/20/15  Yes [provider]  levocetirizine (XYZAL) 5 MG tablet Take 5 mg by mouth every evening.   Yes [provider]  metFORMIN (GLUCOPHAGE) 500 MG tablet Take 500 mg by mouth daily with breakfast.    Yes [provider]  montelukast (SINGULAIR) 10 MG tablet Take 10 mg by mouth at bedtime.   Yes [provider]  pantoprazole (PROTONIX) 40 MG tablet Take 40 mg by mouth daily 01/15/16  Yes [provider]  rosuvastatin (CRESTOR) 20 MG tablet  Take 10 mg by mouth every other day. Take 10 mg by mouth every other night   Yes [provider]     Positive ROS: All other systems have been reviewed and were otherwise negative with the exception of those mentioned in the HPI and as above.  Physical Exam: General: Alert, no acute distress Cardiovascular: No pedal edema Respiratory: No cyanosis, no use of accessory musculature GI: No organomegaly, abdomen is soft and non-tender Skin: No lesions in the area of chief complaint Neurologic: Sensation intact distally Psychiatric: Patient is competent for consent with normal mood and affect Lymphatic: No axillary or cervical lymphadenopathy  MUSCULOSKELETAL: right hip arom 0-80, no IR, painful arc, ehl intact  Assessment: Right hip primary localized osteoarthritis   Plan: Plan for Procedure(s): TOTAL HIP ARTHROPLASTY  The risks benefits and alternatives were discussed with the patient including but not limited to the risks of nonoperative treatment, versus surgical intervention including infection, bleeding, nerve injury,  blood clots, cardiopulmonary complications, morbidity, mortality, among others, and they were willing to proceed.   The risks benefits and alternatives were discussed with the patient including but not limited to the risks of nonoperative treatment, versus surgical intervention including infection, bleeding, nerve injury, periprosthetic fracture, the need for revision surgery, dislocation, leg length discrepancy, blood clots, cardiopulmonary complications, morbidity, mortality, among others, and they were willing to proceed.     Anticipated LOS equal to or greater than 2 midnights due to - Age 72 and older with one or more of the following:  - Obesity  - Expected need for hospital services (PT, OT, Nursing) required for safe  discharge  - Anticipated need for postoperative skilled nursing care or inpatient rehab  -  Past Medical History:  Diagnosis Date   . Arthritis   . Arthritis of knee, right   . Arthritis of right hip    End Stage  . Diabetes mellitus without complication (HCC)    Type II  . GERD (gastroesophageal reflux disease)   . Gout   . Hypertension   . Sleep apnea    Uses CPAP    Inpatient only procedure.  Eulas PostJoshua P Arliss Frisina, MD Cell [phone]   09/16/2017 7:18 AM

## 2017-09-17 ENCOUNTER — Encounter (HOSPITAL_COMMUNITY): Payer: Self-pay | Admitting: Orthopedic Surgery

## 2017-09-17 LAB — CBC
HEMATOCRIT: 36.9 % — AB (ref 39.0–52.0)
HEMOGLOBIN: 11.9 g/dL — AB (ref 13.0–17.0)
MCH: 30.7 pg (ref 26.0–34.0)
MCHC: 32.2 g/dL (ref 30.0–36.0)
MCV: 95.1 fL (ref 78.0–100.0)
Platelets: 206 10*3/uL (ref 150–400)
RBC: 3.88 MIL/uL — ABNORMAL LOW (ref 4.22–5.81)
RDW: 14.1 % (ref 11.5–15.5)
WBC: 10.4 10*3/uL (ref 4.0–10.5)

## 2017-09-17 LAB — BASIC METABOLIC PANEL
ANION GAP: 12 (ref 5–15)
BUN: 16 mg/dL (ref 6–20)
CALCIUM: 8.9 mg/dL (ref 8.9–10.3)
CO2: 23 mmol/L (ref 22–32)
Chloride: 104 mmol/L (ref 101–111)
Creatinine, Ser: 1.43 mg/dL — ABNORMAL HIGH (ref 0.61–1.24)
GFR calc non Af Amer: 48 mL/min — ABNORMAL LOW (ref >60)
GFR, EST AFRICAN AMERICAN: 55 mL/min — AB (ref >60)
Glucose, Bld: 133 mg/dL — ABNORMAL HIGH (ref 65–99)
Potassium: 3.8 mmol/L (ref 3.5–5.1)
Sodium: 139 mmol/L (ref 135–145)

## 2017-09-17 NOTE — Progress Notes (Signed)
Physical Therapy Treatment Patient Details Name: Jeffery Friedman MRN: [id number] DOB: 1946-01-25 Today's Date: 09/17/2017    History of Present Illness Pt is a 72 y/o male s/p elective R THA, posterior approach. PMH includes DM, HTN, gout, and OSA on CPAP.     PT Comments    Patient tolerated session well and with no c/o dizziness with mobility. Overall pt required supervision/min guard for safe OOB mobility. Continue to progress as tolerated.   BP in sitting 144/71       In standing 129/74       In sitting post ambulating 6475ft 144/67   Follow Up Recommendations  Follow surgeon's recommendation for DC plan and follow-up therapies;Supervision for mobility/OOB     Equipment Recommendations  Rolling walker with 5" wheels;3in1 (PT)    Recommendations for Other Services OT consult     Precautions / Restrictions Precautions Precautions: Posterior Hip Precaution Booklet Issued: Yes (comment) Precaution Comments: pt able to recall 3/3 precautions Required Braces or Orthoses: Knee Immobilizer - Right Knee Immobilizer - Right: Other (comment)(when in the bed) Restrictions Weight Bearing Restrictions: Yes RLE Weight Bearing: Weight bearing as tolerated    Mobility  Bed Mobility               General bed mobility comments: pt OOB in chair upon arrival  Transfers Overall transfer level: Needs assistance Equipment used: Rolling walker (2 wheeled) Transfers: Sit to/from Stand Sit to Stand: Supervision Stand pivot transfers: Min guard       General transfer comment: close supervision for safety  Ambulation/Gait Ambulation/Gait assistance: Supervision Ambulation Distance (Feet): 75 Feet Assistive device: Rolling walker (2 wheeled) Gait Pattern/deviations: Step-through pattern;Decreased stride length Gait velocity: decreased   General Gait Details: cues for posture and increased stride length   Stairs Stairs: Yes   Stair Management: Two rails;Step to  pattern;Forwards Number of Stairs: 2 General stair comments: cues for sequencing and technique  Wheelchair Mobility    Modified Rankin (Stroke Patients Only)       Balance Overall balance assessment: Needs assistance Sitting-balance support: No upper extremity supported;Feet supported Sitting balance-Leahy Scale: Good     Standing balance support: Bilateral upper extremity supported;No upper extremity supported;During functional activity Standing balance-Leahy Scale: Fair Standing balance comment: Able to maintain static standing without UE support for toileting tasks.                             Cognition Arousal/Alertness: Awake/alert Behavior During Therapy: WFL for tasks assessed/performed Overall Cognitive Status: Within Functional Limits for tasks assessed                                        Exercises Total Joint Exercises Ankle Circles/Pumps: AROM;Both;20 reps Quad Sets: AROM;Right;10 reps Short Arc Quad: AROM;Right;10 reps Heel Slides: AROM;Right;10 reps Hip ABduction/ADduction: AAROM;Right;10 reps Long Arc Quad: AROM;Right;10 reps;Seated    General Comments General comments (skin integrity, edema, etc.): no c/o dizziness with mobility; BP in sitting 144/71, in standing 129/74, and in sitting post ambulating 3275ft 144/67      Pertinent Vitals/Pain Pain Assessment: Faces Faces Pain Scale: Hurts little more Pain Location: R hip  Pain Descriptors / Indicators: Sore;Guarding Pain Intervention(s): Limited activity within patient's tolerance;Monitored during session;Repositioned;RN gave pain meds during session    Home Living Family/patient expects to be discharged to:: Private residence Living Arrangements: Spouse/significant other  Available Help at Discharge: Family;Available 24 hours/day Type of Home: House Home Access: Stairs to enter Entrance Stairs-Rails: Right;Left;Can reach both Home Layout: One level Home Equipment:  Shower seat;Grab bars - toilet;Grab bars - tub/shower      Prior Function Level of Independence: Independent          PT Goals (current goals can now be found in the care plan section) Acute Rehab PT Goals Patient Stated Goal: to go home  PT Goal Formulation: With patient Time For Goal Achievement: 09/30/17 Potential to Achieve Goals: Good Progress towards PT goals: Progressing toward goals    Frequency    7X/week      PT Plan Current plan remains appropriate    Co-evaluation              AM-PAC PT "6 Clicks" Daily Activity  Outcome Measure  Difficulty turning over in bed (including adjusting bedclothes, sheets and blankets)?: A Little Difficulty moving from lying on back to sitting on the side of the bed? : A Lot Difficulty sitting down on and standing up from a chair with arms (e.g., wheelchair, bedside commode, etc,.)?: Unable Help needed moving to and from a bed to chair (including a wheelchair)?: A Little Help needed walking in hospital room?: A Little Help needed climbing 3-5 steps with a railing? : A Little 6 Click Score: 15    End of Session Equipment Utilized During Treatment: Gait belt Activity Tolerance: Patient tolerated treatment well Patient left: in chair;with call bell/phone within reach;with family/visitor present Nurse Communication: Mobility status PT Visit Diagnosis: Other abnormalities of gait and mobility (R26.89);Pain Pain - Right/Left: Right Pain - part of body: Hip     Time: 1610-9604 PT Time Calculation (min) (ACUTE ONLY): 34 min  Charges:  $Gait Training: 8-22 mins $Therapeutic Exercise: 8-22 mins                     G Codes:       Erline Levine, PTA Pager: [phone]     Carolynne Edouard 09/17/2017, 2:30 PM

## 2017-09-17 NOTE — Progress Notes (Signed)
     Subjective:  Patient reports pain as mild.  Got light headed yesterday.  Gets this with blood donation too.  Now feeling better.   Objective:   VITALS:   Vitals:   09/16/17 1130 09/16/17 1300 09/16/17 2011 09/17/17 0535  BP: 121/78 (!) 58/42 138/77 (!) 151/75  Pulse: 62  75 68  Resp: 14  16 16   Temp: 98.7 F (37.1 C)  98.2 F (36.8 C) 98.1 F (36.7 C)  TempSrc: Oral  Oral Oral  SpO2: 95%  95% 98%  Weight:        Neurologically intact Dorsiflexion/Plantar flexion intact Incision: scant drainage   Lab Results  Component Value Date   WBC 10.4 09/17/2017   HGB 11.9 (L) 09/17/2017   HCT 36.9 (L) 09/17/2017   MCV 95.1 09/17/2017   PLT 206 09/17/2017   BMET    Component Value Date/Time   NA 139 09/17/2017 0524   K 3.8 09/17/2017 0524   CL 104 09/17/2017 0524   CO2 23 09/17/2017 0524   GLUCOSE 133 (H) 09/17/2017 0524   BUN 16 09/17/2017 0524   CREATININE 1.43 (H) 09/17/2017 0524   CREATININE 1.52 (H) 03/03/2013 1721   CALCIUM 8.9 09/17/2017 0524   GFRNONAA 48 (L) 09/17/2017 0524   GFRAA 55 (L) 09/17/2017 0524     Assessment/Plan: 1 Day Friedman-Op   Principal Problem:   Primary localized osteoarthritis of right hip Active Problems:   Primary localized osteoarthritis of hip   Advance diet Up with therapy Plan for discharge tomorrow       Jeffery PostJoshua P Devell Friedman 09/17/2017, 8:18 AM   Teryl LucyJoshua Mariesa Grieder, MD Cell [phone]

## 2017-09-17 NOTE — Care Management Note (Signed)
Case Management Note  Patient Details  Name: Jeffery Friedman MRN: [id number] Date of Birth: 15-Sep-1945  Subjective/Objective: 72 yr old gentleman s/p right total hip arthroplasty.                  Action/Plan: Patient was preoperatively setup with [facility], no changes. Will have support at discharge.   Expected Discharge Date:   09/18/17               Expected Discharge Plan:  Home w Home Health Services  In-House Referral:  NA  Discharge planning Services  CM Consult  Post Acute Care Choice:  Durable Medical Equipment, Home Health Choice offered to:  Patient  DME Arranged:  3-N-1, Walker rolling DME Agency:  [employer]  HH Arranged:  PT HH Agency[facility]  [facility]  Status of Service:  Completed, signed off  If discussed at [employer] Meetings, dates discussed:    Additional Comments:  [name], [name], RN 09/17/2017, 2:18 PM

## 2017-09-17 NOTE — Progress Notes (Signed)
Physical Therapy Treatment Patient Details Name: Jeffery Friedman MRN: [id number] DOB: 10/16/1945 Today's Date: 09/17/2017    History of Present Illness Pt is a 72 y/o male s/p elective R THA, posterior approach. PMH includes DM, HTN, gout, and OSA on CPAP.     PT Comments    Patient is progressing toward mobility goals however limited by lightheadedness/dizziness. Pt diaphoretic and pallid with BP 137/118 (122) and after sitting >5 mins BP 129/79 (89). SpO2 remains 95% or > on RA and HR 52-69 bpm. RN notified. Pt sitting up in recliner end of session and asymptomatic. Continue to progress as tolerated.    Follow Up Recommendations  Follow surgeon's recommendation for DC plan and follow-up therapies;Supervision for mobility/OOB     Equipment Recommendations  Rolling walker with 5" wheels;3in1 (PT)    Recommendations for Other Services OT consult     Precautions / Restrictions Precautions Precautions: Posterior Hip Precaution Booklet Issued: Yes (comment) Precaution Comments: Reviewed hip precautions with pt Required Braces or Orthoses: Knee Immobilizer - Right Knee Immobilizer - Right: Other (comment)(when in the bed) Restrictions Weight Bearing Restrictions: Yes RLE Weight Bearing: Weight bearing as tolerated    Mobility  Bed Mobility               General bed mobility comments: pt OOB in chair upon arrival  Transfers Overall transfer level: Needs assistance Equipment used: Rolling walker (2 wheeled) Transfers: Sit to/from [employer] CorporationStand;Stand Pivot Transfers Sit to Stand: Supervision;Min guard Stand pivot transfers: Min guard       General transfer comment: for safety; cues for safe hand placement  Ambulation/Gait Ambulation/Gait assistance: Min guard;Supervision Ambulation Distance (Feet): 100 Feet Assistive device: Rolling walker (2 wheeled) Gait Pattern/deviations: Step-through pattern;Decreased stride length     General Gait Details: cues for sequencing and  posture   Stairs Stairs: Yes   Stair Management: Two rails;Step to pattern;Forwards Number of Stairs: 2 General stair comments: cues for sequencing and technique  Wheelchair Mobility    Modified Rankin (Stroke Patients Only)       Balance Overall balance assessment: Needs assistance Sitting-balance support: No upper extremity supported;Feet supported Sitting balance-Leahy Scale: Good     Standing balance support: Bilateral upper extremity supported;No upper extremity supported;During functional activity Standing balance-Leahy Scale: Fair                              Cognition Arousal/Alertness: Awake/alert Behavior During Therapy: WFL for tasks assessed/performed Overall Cognitive Status: Within Functional Limits for tasks assessed                                        Exercises      General Comments General comments (skin integrity, edema, etc.): pt with c/o dizziness/lightheadedness and diaphoretic; BP 137/118 (122) Pulse 52 and feeling of heart racing; after sitting >5 mins BP 129/79 (89) and pulse 69; SpO2 95% on RA      Pertinent Vitals/Pain Pain Assessment: Faces Faces Pain Scale: Hurts a little bit Pain Location: R hip  Pain Descriptors / Indicators: Sore Pain Intervention(s): Limited activity within patient's tolerance;Monitored during session;Repositioned    Home Living                      Prior Function            PT Goals (current goals can  now be found in the care plan section) Acute Rehab PT Goals Patient Stated Goal: to go home  PT Goal Formulation: With patient Time For Goal Achievement: 09/30/17 Potential to Achieve Goals: Good Progress towards PT goals: Progressing toward goals    Frequency    7X/week      PT Plan Current plan remains appropriate    Co-evaluation              AM-PAC PT "6 Clicks" Daily Activity  Outcome Measure  Difficulty turning over in bed (including adjusting  bedclothes, sheets and blankets)?: A Little Difficulty moving from lying on back to sitting on the side of the bed? : A Lot Difficulty sitting down on and standing up from a chair with arms (e.g., wheelchair, bedside commode, etc,.)?: Unable Help needed moving to and from a bed to chair (including a wheelchair)?: A Little Help needed walking in hospital room?: A Little Help needed climbing 3-5 steps with a railing? : A Little 6 Click Score: 15    End of Session Equipment Utilized During Treatment: Gait belt Activity Tolerance: Patient tolerated treatment well Patient left: in chair;with call bell/phone within reach;with family/visitor present Nurse Communication: Mobility status PT Visit Diagnosis: Other abnormalities of gait and mobility (R26.89);Pain Pain - Right/Left: Right Pain - part of body: Hip     Time: 5188-4166 PT Time Calculation (min) (ACUTE ONLY): 51 min  Charges:  $Gait Training: 8-22 mins $Therapeutic Activity: 23-37 mins                    G Codes:       Erline Levine, PTA Pager: [phone]     Carolynne Edouard 09/17/2017, 10:49 AM

## 2017-09-17 NOTE — Progress Notes (Signed)
Pt already wearing CPAP when RT came by. Pt comfortable with current settings. Advised pt to call for RT if any further assistance needed.

## 2017-09-17 NOTE — Care Plan (Signed)
Patient seen in the hospital on rounds today. Anticipate discharge later today or tomorrow as planned. Having some vaso-vagal issues but states he has a history of these.  Spoke with patient pre-op and arranged HHPT with [facility] and ordered rolling walker and 3n1 from [employer]. Equipment will be delivered to his room prior to discharge. [facility] [place] [facility] [place] at 1100 and will see Dr. Dion SaucierLandau in the office on 09/29/17 at 830  Please contact Renee Angiulli, RNCM [phone]- with questions or if plan needs to change   Thanks

## 2017-09-17 NOTE — Progress Notes (Signed)
Occupational Therapy Evaluation Patient Details Name: Jeffery Friedman MRN: [id number] DOB: 11/07/1945 Today's Date: 09/17/2017    History of Present Illness Pt is a 72 y/o male s/p elective R THA, posterior approach. PMH includes DM, HTN, gout, and OSA on CPAP.    Clinical Impression   PTA pt independent with ADL and mobility. Pt currently requires Mod A with ADL. Began education on ADL retraining with use of AE and DME while adhering to posterior hip precautions. Pt will benefit from acute OT to complete education and facilitate safe DC home. Pt very appreciative.     Follow Up Recommendations  No OT follow up;Supervision - Intermittent    Equipment Recommendations  3 in 1 bedside commode(to use as shower seat)    Recommendations for Other Services       Precautions / Restrictions Precautions Precautions: Posterior Hip Precaution Booklet Issued: Yes (comment) Precaution Comments: Reviewed hip precautions with pt Required Braces or Orthoses: Knee Immobilizer - Right Knee Immobilizer - Right: (when in the bed) Restrictions Weight Bearing Restrictions: Yes RLE Weight Bearing: Weight bearing as tolerated      Mobility Bed Mobility               General bed mobility comments: pt OOB in chair upon arrival  Transfers Overall transfer level: Needs assistance Equipment used: Rolling walker (2 wheeled) Transfers: Sit to/from [facility] Sit to Stand: Supervision;Min guard Stand pivot transfers: Min guard       General transfer comment: per PT eval    Balance Overall balance assessment: Needs assistance Sitting-balance support: No upper extremity supported;Feet supported Sitting balance-Leahy Scale: Good     Standing balance support: Bilateral upper extremity supported;No upper extremity supported;During functional activity Standing balance-Leahy Scale: Fair Standing balance comment: Able to maintain static standing without UE support for toileting  tasks.                            ADL either performed or assessed with clinical judgement   ADL Overall ADL's : Needs assistance/impaired     Grooming: Set up;Sitting   Upper Body Bathing: Set up;Sitting   Lower Body Bathing: Moderate assistance;Sit to/from stand   Upper Body Dressing : Set up;Sitting   Lower Body Dressing: Moderate assistance;Sit to/from stand                 General ADL Comments: Began educatin gpt on use of AE for LB ADL; Pt needs more practice; Wants to be independent as his wife works  Educated on TEFL teacher.     Vision         Perception     Praxis      Pertinent Vitals/Pain Pain Assessment: Faces Faces Pain Scale: Hurts a little bit Pain Location: R hip  Pain Descriptors / Indicators: Sore Pain Intervention(s): Limited activity within patient's tolerance;Repositioned;Ice applied     Hand Dominance Right   Extremity/Trunk Assessment Upper Extremity Assessment Upper Extremity Assessment: Overall WFL for tasks assessed   Lower Extremity Assessment Lower Extremity Assessment: Defer to PT evaluation RLE Deficits / Details: Sensory in tact. Derficits consistent with post op pain and weakness. Able to perform ther ex below.    Cervical / Trunk Assessment Cervical / Trunk Assessment: Normal   Communication Communication Communication: No difficulties   Cognition Arousal/Alertness: Awake/alert Behavior During Therapy: WFL for tasks assessed/performed Overall Cognitive Status: Within Functional Limits for tasks assessed  General Comments  pt with c/o dizziness/lightheadedness and diaphoretic; BP 137/118 (122) Pulse 52 and feeling of heart racing; after sitting >5 mins BP 129/79 (89) and pulse 69; SpO2 95% on RA    Exercises Total Joint Exercises Heel Slides: (within precautions )   Shoulder Instructions      Home Living Family/patient expects to be  discharged to:: Private residence Living Arrangements: Spouse/significant other Available Help at Discharge: Family;Available 24 hours/day Type of Home: House Home Access: Stairs to enter [employer] of Steps: 4 Entrance Stairs-Rails: Right;Left;Can reach both Home Layout: One level     Bathroom Shower/Tub: Occupational psychologist: Handicapped height Bathroom Accessibility: Yes How Accessible: Accessible via walker Home Equipment: Shower seat;Grab bars - toilet;Grab bars - tub/shower          Prior Functioning/Environment Level of Independence: Independent                 OT Problem List: Decreased strength;Decreased range of motion;Impaired balance (sitting and/or standing);Decreased safety awareness;Decreased knowledge of use of DME or AE;Decreased knowledge of precautions;Pain      OT Treatment/Interventions: Self-care/ADL training;DME and/or AE instruction;Therapeutic activities;Balance training    OT Goals(Current goals can be found in the care plan section) Acute Rehab OT Goals Patient Stated Goal: to go home and be independent OT Goal Formulation: With patient Time For Goal Achievement: 10/01/17 Potential to Achieve Goals: Good  OT Frequency: Min 3X/week   Barriers to D/C:            Co-evaluation              AM-PAC PT "6 Clicks" Daily Activity     Outcome Measure Help from another person eating meals?: None Help from another person taking care of personal grooming?: None Help from another person toileting, which includes using toliet, bedpan, or urinal?: A Little Help from another person bathing (including washing, rinsing, drying)?: A Lot Help from another person to put on and taking off regular upper body clothing?: None Help from another person to put on and taking off regular lower body clothing?: A Lot 6 Click Score: 19   End of Session Nurse Communication: Precautions  Activity Tolerance: Patient tolerated treatment  well Patient left: in chair;with call bell/phone within reach  OT Visit Diagnosis: Unsteadiness on feet (R26.81);Muscle weakness (generalized) (M62.81);Pain Pain - Right/Left: Right Pain - part of body: Hip                Time: 1043-1110 OT Time Calculation (min): 27 min Charges:  OT General Charges $OT Visit: 1 Visit OT Evaluation $OT Eval Low Complexity: 1 Low OT Treatments $Self Care/Home Management : 8-22 mins G-Codes:     [facility], OT/L  [phone] 09/17/2017  Rylynn Kobs,HILLARY 09/17/2017, 11:23 AM

## 2017-09-18 LAB — BASIC METABOLIC PANEL
Anion gap: 7 (ref 5–15)
BUN: 16 mg/dL (ref 6–20)
CHLORIDE: 107 mmol/L (ref 101–111)
CO2: 24 mmol/L (ref 22–32)
Calcium: 8.4 mg/dL — ABNORMAL LOW (ref 8.9–10.3)
Creatinine, Ser: 1.28 mg/dL — ABNORMAL HIGH (ref 0.61–1.24)
GFR calc Af Amer: >60 mL/min (ref >60)
GFR, EST NON AFRICAN AMERICAN: 55 mL/min — AB (ref >60)
GLUCOSE: 123 mg/dL — AB (ref 65–99)
POTASSIUM: 4.3 mmol/L (ref 3.5–5.1)
Sodium: 138 mmol/L (ref 135–145)

## 2017-09-18 LAB — CBC
HCT: 31.8 % — ABNORMAL LOW (ref 39.0–52.0)
Hemoglobin: 10.3 g/dL — ABNORMAL LOW (ref 13.0–17.0)
MCH: 31.1 pg (ref 26.0–34.0)
MCHC: 32.4 g/dL (ref 30.0–36.0)
MCV: 96.1 fL (ref 78.0–100.0)
PLATELETS: 191 10*3/uL (ref 150–400)
RBC: 3.31 MIL/uL — AB (ref 4.22–5.81)
RDW: 14.7 % (ref 11.5–15.5)
WBC: 10.5 10*3/uL (ref 4.0–10.5)

## 2017-09-18 NOTE — Plan of Care (Signed)
  Problem: Education: Goal: Knowledge of General Education information will improve Outcome: Progressing Note:  POC reviewed with pt.; mobility is very good.

## 2017-09-18 NOTE — Progress Notes (Signed)
Occupational Therapy Treatment Patient Details Name: Jeffery Friedman MRN: [id number] DOB: 1946/04/18 Today's Date: 09/18/2017    History of present illness Pt is a 72 y/o male s/p elective R THA, posterior approach. PMH includes DM, HTN, gout, and OSA on CPAP.    OT comments  Pt progressing towards acute OT goals. Focus of session was LB dressing and review of ADL strategies for posterior hip precautions. Spouse present and included in education. D/c plan remains appropriate.   Follow Up Recommendations  No OT follow up;Supervision - Intermittent    Equipment Recommendations  3 in 1 bedside commode    Recommendations for Other Services      Precautions / Restrictions Precautions Precautions: Posterior Hip Precaution Comments: reviewed precautions and strategies Required Braces or Orthoses: Knee Immobilizer - Right Knee Immobilizer - Right: (when in the bed) Restrictions Weight Bearing Restrictions: Yes RLE Weight Bearing: Weight bearing as tolerated       Mobility Bed Mobility               General bed mobility comments: pt OOB in chair upon arrival  Transfers Overall transfer level: Needs assistance Equipment used: Rolling walker (2 wheeled) Transfers: Sit to/from Stand Sit to Stand: Supervision         General transfer comment: for safety; good technique    Balance Overall balance assessment: Needs assistance Sitting-balance support: No upper extremity supported;Feet supported Sitting balance-Leahy Scale: Good     Standing balance support: Bilateral upper extremity supported;No upper extremity supported;During functional activity Standing balance-Leahy Scale: Fair Standing balance comment: Able to maintain static standing without UE support for toileting tasks.                            ADL either performed or assessed with clinical judgement   ADL                   Upper Body Dressing : Min guard;Standing   Lower Body Dressing:  Moderate assistance;Sit to/from stand                 General ADL Comments: Pt and spouse report spouse will assist with LB ADLs. Pt completed UB/LB dressing with spouse included in education. Reviewed ADL strategies for posterior hip precautions.      Vision       Perception     Praxis      Cognition Arousal/Alertness: Awake/alert Behavior During Therapy: WFL for tasks assessed/performed Overall Cognitive Status: Within Functional Limits for tasks assessed                                          Exercises Exercises: Total Joint Total Joint Exercises Hip ABduction/ADduction: Right;10 reps;AROM;Standing Knee Flexion: AROM;Right;10 reps;Standing Marching in Standing: AROM;Right;10 reps;Standing Standing Hip Extension: AROM;Right;10 reps;Standing   Shoulder Instructions       General Comments      Pertinent Vitals/ Pain       Pain Assessment: Faces Faces Pain Scale: Hurts a little bit Pain Location: R hip  Pain Descriptors / Indicators: Sore Pain Intervention(s): Monitored during session  Home Living                                          Prior  Functioning/Environment              Frequency  Min 3X/week        Progress Toward Goals  OT Goals(current goals can now be found in the care plan section)  Progress towards OT goals: Progressing toward goals  Acute Rehab OT Goals Patient Stated Goal: to go home  OT Goal Formulation: With patient Time For Goal Achievement: 10/01/17 Potential to Achieve Goals: Good ADL Goals Pt Will Perform Lower Body Bathing: with modified independence;with adaptive equipment;sit to/from stand Pt Will Perform Lower Body Dressing: with modified independence;sit to/from stand;with adaptive equipment Pt Will Transfer to Toilet: with modified independence;ambulating;grab bars;bedside commode Pt Will Perform Toileting - Clothing Manipulation and hygiene: with modified independence;sit  to/from stand Pt Will Perform Tub/Shower Transfer: Shower transfer;ambulating;3 in 1;rolling walker  Plan Discharge plan remains appropriate    Co-evaluation                 AM-PAC PT "6 Clicks" Daily Activity     Outcome Measure   Help from another person eating meals?: None Help from another person taking care of personal grooming?: None Help from another person toileting, which includes using toliet, bedpan, or urinal?: A Little Help from another person bathing (including washing, rinsing, drying)?: A Lot Help from another person to put on and taking off regular upper body clothing?: None Help from another person to put on and taking off regular lower body clothing?: A Lot 6 Click Score: 19    End of Session Equipment Utilized During Treatment: Rolling walker  OT Visit Diagnosis: Unsteadiness on feet (R26.81);Muscle weakness (generalized) (M62.81);Pain Pain - Right/Left: Right Pain - part of body: Hip   Activity Tolerance Patient tolerated treatment well   Patient Left in chair;with call bell/phone within reach;with nursing/sitter in room;with family/visitor present   Nurse Communication          Time: [id number] OT Time Calculation (min): 24 min  Charges: OT General Charges $OT Visit: 1 Visit OT Treatments $Self Care/Home Management : 23-37 mins     Pilar GrammesMathews, Shaylyn Bawa H 09/18/2017, 12:44 PM

## 2017-09-18 NOTE — Progress Notes (Signed)
Patient ID: Rise PaganiniJames [place], male   DOB: 01-Nov-1945, 72 y.o.   MRN: [id number]     Subjective:  Patient reports pain as mild.  Patient denies any CP or SOB.  Patient wants to go home  Objective:   VITALS:   Vitals:   09/17/17 0535 09/17/17 1626 09/17/17 2046 09/18/17 0453  BP: (!) 151/75 124/70 134/70 138/74  Pulse: 68 84 80 72  Resp: 16  16 16   Temp: 98.1 F (36.7 C) 98.7 F (37.1 C) 99.3 F (37.4 C) 98.1 F (36.7 C)  TempSrc: Oral Oral Oral Oral  SpO2: 98% 94% 95% 95%  Weight:        ABD soft Sensation intact distally Dorsiflexion/Plantar flexion intact Incision: dressing C/D/I and no drainage   Lab Results  Component Value Date   WBC 10.5 09/18/2017   HGB 10.3 (L) 09/18/2017   HCT 31.8 (L) 09/18/2017   MCV 96.1 09/18/2017   PLT 191 09/18/2017   BMET    Component Value Date/Time   NA 138 09/18/2017 0453   K 4.3 09/18/2017 0453   CL 107 09/18/2017 0453   CO2 24 09/18/2017 0453   GLUCOSE 123 (H) 09/18/2017 0453   BUN 16 09/18/2017 0453   CREATININE 1.28 (H) 09/18/2017 0453   CREATININE 1.52 (H) 03/03/2013 1721   CALCIUM 8.4 (L) 09/18/2017 0453   GFRNONAA 55 (L) 09/18/2017 0453   GFRAA >60 09/18/2017 0453     Assessment/Plan: 2 Days Post-Op   Principal Problem:   Primary localized osteoarthritis of right hip   Advance diet Up with therapy Discharge home with home health WBAT Dry dressing PRN Follow up with Dr Dion SaucierLandau as scheduled       Torrie MayersUGLAS Bexley Laubach, Apolinar JunesBRANDON 09/18/2017, 10:33 AM   Teryl LucyJoshua Landau, MD Cell [phone]

## 2017-09-18 NOTE — Discharge Summary (Signed)
Physician Discharge Summary  Patient ID: Jeffery Friedman MRN: [id number] DOB/AGE: 1946/06/13 72 y.o.  Admit date: 09/16/2017 Discharge date: 09/18/2017  Admission Diagnoses:  Primary localized osteoarthritis of right hip  Discharge Diagnoses:  Principal Problem:   Primary localized osteoarthritis of right hip   Past Medical History:  Diagnosis Date  . Arthritis   . Arthritis of knee, right   . Arthritis of right hip    End Stage  . Diabetes mellitus without complication (HCC)    Type II  . GERD (gastroesophageal reflux disease)   . Gout   . Hypertension   . Primary localized osteoarthritis of right hip 09/16/2017  . Sleep apnea    Uses CPAP    Surgeries: Procedure(s): TOTAL HIP ARTHROPLASTY on 09/16/2017   Consultants (if any):   Discharged Condition: Improved  Hospital Course: Jeffery Friedman is an 72 y.o. male who was admitted 09/16/2017 with a diagnosis of Primary localized osteoarthritis of right hip and went to the operating room on 09/16/2017 and underwent the above named procedures.    He was given perioperative antibiotics:  Anti-infectives (From admission, onward)   Start     Dose/Rate Route Frequency Ordered Stop   09/16/17 1545  ceFAZolin (ANCEF) IVPB 2g/100 mL premix     2 g 200 mL/hr over 30 Minutes Intravenous Every 8 hours 09/16/17 1133 09/17/17 0744   09/16/17 0626  ceFAZolin (ANCEF) IVPB 2g/100 mL premix     2 g 200 mL/hr over 30 Minutes Intravenous On call to O.R. 09/16/17 [phone] 09/16/17 0815    .  He was given sequential compression devices, early ambulation, and xarelto for DVT prophylaxis.  He benefited maximally from the hospital stay and there were no complications.    Recent vital signs:  Vitals:   09/17/17 2046 09/18/17 0453  BP: 134/70 138/74  Pulse: 80 72  Resp: 16 16  Temp: 99.3 F (37.4 C) 98.1 F (36.7 C)  SpO2: 95% 95%    Recent laboratory studies:  Lab Results  Component Value Date   HGB 10.3 (L) 09/18/2017   HGB 11.9 (L)  09/17/2017   HGB 12.3 (L) 09/16/2017   Lab Results  Component Value Date   WBC 10.5 09/18/2017   PLT 191 09/18/2017   No results found for: INR Lab Results  Component Value Date   NA 138 09/18/2017   K 4.3 09/18/2017   CL 107 09/18/2017   CO2 24 09/18/2017   BUN 16 09/18/2017   CREATININE 1.28 (H) 09/18/2017   GLUCOSE 123 (H) 09/18/2017    Discharge Medications:   Allergies as of 09/18/2017      Reactions   Hydrocodone-homatropine Other (See Comments)   "loopy"      Medication List    STOP taking these medications   aspirin 81 MG tablet     TAKE these medications   allopurinol 300 MG tablet Commonly known as:  ZYLOPRIM Take 300 mg by mouth daily.   amLODipine 5 MG tablet Commonly known as:  NORVASC Take 5 mg by mouth at bedtime.   azelastine 0.05 % ophthalmic solution Commonly known as:  OPTIVAR Place 1 drop into both eyes 2 (two) times daily as needed (for allergy relief).   azelastine 0.1 % nasal spray Commonly known as:  ASTELIN Place 1 spray into both nostrils 2 (two) times daily.   baclofen 10 MG tablet Commonly known as:  LIORESAL Take 1 tablet (10 mg total) by mouth 3 (three) times daily. As needed for muscle spasm  benazepril 40 MG tablet Commonly known as:  LOTENSIN Take 40 mg by mouth at bedtime.   FIBER ADULT GUMMIES PO Take 2 each by mouth daily.   fluticasone 50 MCG/ACT nasal spray Commonly known as:  FLONASE Place 1 spray in each nostril in the morning   HYDROcodone-acetaminophen 5-325 MG tablet Commonly known as:  NORCO Take 1-2 tablets by mouth every 6 (six) hours as needed for moderate pain. MAXIMUM TOTAL ACETAMINOPHEN DOSE IS 4000 MG PER DAY   levocetirizine 5 MG tablet Commonly known as:  XYZAL Take 5 mg by mouth every evening.   metFORMIN 500 MG tablet Commonly known as:  GLUCOPHAGE Take 500 mg by mouth daily with breakfast.   montelukast 10 MG tablet Commonly known as:  SINGULAIR Take 10 mg by mouth at bedtime.    ondansetron 4 MG tablet Commonly known as:  ZOFRAN Take 1 tablet (4 mg total) by mouth every 8 (eight) hours as needed for nausea or vomiting.   pantoprazole 40 MG tablet Commonly known as:  PROTONIX Take 40 mg by mouth daily   rivaroxaban 10 MG Tabs tablet Commonly known as:  XARELTO Take 1 tablet (10 mg total) by mouth daily.   rosuvastatin 20 MG tablet Commonly known as:  CRESTOR Take 10 mg by mouth every other day. Take 10 mg by mouth every other night   sennosides-docusate sodium 8.6-50 MG tablet Commonly known as:  SENOKOT-S Take 2 tablets by mouth daily.       Diagnostic Studies: Dg Hip Port Unilat With Pelvis 1v Right  Result Date: 09/16/2017 CLINICAL DATA:  Status post right total hip joint prosthesis placement. EXAM: DG HIP (WITH OR WITHOUT PELVIS) 1V PORT RIGHT COMPARISON:  None in PACs FINDINGS: There is a prosthetic right hip joint present. The interface with the native bone appears normal. No acute native bone abnormality is observed. There is some soft tissue gas laterally. IMPRESSION: No immediate postprocedure complication following right total hip joint prosthesis placement. Electronically Signed   By: David  [place] M.D.   On: 09/16/2017 10:54    Disposition:     Follow-up Information    Teryl Lucy, MD. Schedule an appointment as soon as possible for a visit in 2 weeks.   Specialty:  Orthopedic Surgery Contact information: [address] [place] [address] [phone]        [facility] Follow up.   Specialty:  Home Health Services Why:  A representative from [facility] will contact you to arrange start date and time for your therapy, Contact information: [address] [place] [address] [place] [address] [phone]            Signed: Eulas Post 09/18/2017, 10:31 AM

## 2017-09-18 NOTE — Progress Notes (Signed)
Physical Therapy Treatment Patient Details Name: Jeffery Friedman MRN: [id number] DOB: Dec 31, 1945 Today's Date: 09/18/2017    History of Present Illness Pt is a 72 y/o male s/p elective R THA, posterior approach. PMH includes DM, HTN, gout, and OSA on CPAP.     PT Comments    Patient is making good progress with PT.  From a mobility standpoint anticipate patient will be ready for DC home when medically ready.    Follow Up Recommendations  Follow surgeon's recommendation for DC plan and follow-up therapies;Supervision for mobility/OOB     Equipment Recommendations  Rolling walker with 5" wheels;3in1 (PT)    Recommendations for Other Services OT consult     Precautions / Restrictions Precautions Precautions: Posterior Hip Precaution Comments: pt able to recall 3/3 precautions Required Braces or Orthoses: Knee Immobilizer - Right Knee Immobilizer - Right: Other (comment)(when in the bed) Restrictions Weight Bearing Restrictions: Yes RLE Weight Bearing: Weight bearing as tolerated    Mobility  Bed Mobility               General bed mobility comments: pt OOB in chair upon arrival  Transfers Overall transfer level: Needs assistance Equipment used: Rolling walker (2 wheeled) Transfers: Sit to/from Stand Sit to Stand: Supervision         General transfer comment: for safety; good technique  Ambulation/Gait Ambulation/Gait assistance: Supervision Ambulation Distance (Feet): 200 Feet Assistive device: Rolling walker (2 wheeled) Gait Pattern/deviations: Step-through pattern;Decreased stride length Gait velocity: decreased   General Gait Details: cues for sequencing and posture   Stairs         General stair comments: verbally reviewed sequencing  Wheelchair Mobility    Modified Rankin (Stroke Patients Only)       Balance Overall balance assessment: Needs assistance Sitting-balance support: No upper extremity supported;Feet supported Sitting  balance-Leahy Scale: Good     Standing balance support: Bilateral upper extremity supported;No upper extremity supported;During functional activity Standing balance-Leahy Scale: Fair                              Cognition Arousal/Alertness: Awake/alert Behavior During Therapy: WFL for tasks assessed/performed Overall Cognitive Status: Within Functional Limits for tasks assessed                                        Exercises Total Joint Exercises Hip ABduction/ADduction: Right;10 reps;AROM;Standing Knee Flexion: AROM;Right;10 reps;Standing Marching in Standing: AROM;Right;10 reps;Standing Standing Hip Extension: AROM;Right;10 reps;Standing    General Comments        Pertinent Vitals/Pain Pain Assessment: Faces Faces Pain Scale: Hurts a little bit Pain Location: R hip  Pain Descriptors / Indicators: Sore Pain Intervention(s): Monitored during session;Premedicated before session;Repositioned;Ice applied    Home Living                      Prior Function            PT Goals (current goals can now be found in the care plan section) Acute Rehab PT Goals Patient Stated Goal: to go home  PT Goal Formulation: With patient Time For Goal Achievement: 09/30/17 Potential to Achieve Goals: Good Progress towards PT goals: Progressing toward goals    Frequency    7X/week      PT Plan Current plan remains appropriate    Co-evaluation  AM-PAC PT "6 Clicks" Daily Activity  Outcome Measure  Difficulty turning over in bed (including adjusting bedclothes, sheets and blankets)?: A Little Difficulty moving from lying on back to sitting on the side of the bed? : A Lot Difficulty sitting down on and standing up from a chair with arms (e.g., wheelchair, bedside commode, etc,.)?: Unable Help needed moving to and from a bed to chair (including a wheelchair)?: A Little Help needed walking in hospital room?: A Little Help  needed climbing 3-5 steps with a railing? : A Little 6 Click Score: 15    End of Session Equipment Utilized During Treatment: Gait belt Activity Tolerance: Patient tolerated treatment well Patient left: in chair;with call bell/phone within reach;with family/visitor present Nurse Communication: Mobility status PT Visit Diagnosis: Other abnormalities of gait and mobility (R26.89);Pain Pain - Right/Left: Right Pain - part of body: Hip     Time: 1610-9604 PT Time Calculation (min) (ACUTE ONLY): 37 min  Charges:  $Gait Training: 8-22 mins $Therapeutic Exercise: 8-22 mins                    G Codes:       Erline Levine, PTA Pager: [phone]     Carolynne Edouard 09/18/2017, 10:46 AM

## 2017-09-18 NOTE — Plan of Care (Signed)

## 2017-10-27 ENCOUNTER — Other Ambulatory Visit: Payer: Self-pay | Admitting: Family Medicine

## 2017-10-27 DIAGNOSIS — K118 Other diseases of salivary glands: Secondary | ICD-10-CM

## 2017-10-30 ENCOUNTER — Ambulatory Visit
Admission: RE | Admit: 2017-10-30 | Discharge: 2017-10-30 | Disposition: A | Discharge status: Home / Self Care | Payer: Medicare Other | Source: Ambulatory Visit | Attending: Family Medicine | Admitting: Family Medicine

## 2017-10-30 DIAGNOSIS — K118 Other diseases of salivary glands: Secondary | ICD-10-CM

## 2017-10-30 MED ORDER — IOPAMIDOL (ISOVUE-300) INJECTION 61%
75.0000 mL | Freq: Once | INTRAVENOUS | Status: AC | PRN
  Administered 2017-10-30: 75 mL via INTRAVENOUS

## 2017-11-03 ENCOUNTER — Other Ambulatory Visit (HOSPITAL_COMMUNITY): Payer: Self-pay | Admitting: Family Medicine

## 2017-11-03 DIAGNOSIS — K118 Other diseases of salivary glands: Secondary | ICD-10-CM

## 2017-11-13 ENCOUNTER — Other Ambulatory Visit: Payer: Self-pay | Admitting: Radiology

## 2017-11-13 ENCOUNTER — Other Ambulatory Visit: Payer: Self-pay | Admitting: Student

## 2017-11-14 ENCOUNTER — Ambulatory Visit (HOSPITAL_COMMUNITY)
Admission: RE | Admit: 2017-11-14 | Discharge: 2017-11-14 | Disposition: A | Discharge status: Home / Self Care | Payer: Medicare Other | Source: Ambulatory Visit | Attending: Family Medicine | Admitting: Family Medicine

## 2017-11-14 ENCOUNTER — Encounter (HOSPITAL_COMMUNITY): Payer: Self-pay

## 2017-11-14 ENCOUNTER — Other Ambulatory Visit (HOSPITAL_COMMUNITY): Payer: Self-pay | Admitting: Family Medicine

## 2017-11-14 DIAGNOSIS — Z7984 Long term (current) use of oral hypoglycemic drugs: Secondary | ICD-10-CM | POA: Insufficient documentation

## 2017-11-14 DIAGNOSIS — K119 Disease of salivary gland, unspecified: Secondary | ICD-10-CM | POA: Insufficient documentation

## 2017-11-14 DIAGNOSIS — K118 Other diseases of salivary glands: Secondary | ICD-10-CM

## 2017-11-14 DIAGNOSIS — G4730 Sleep apnea, unspecified: Secondary | ICD-10-CM | POA: Insufficient documentation

## 2017-11-14 DIAGNOSIS — K219 Gastro-esophageal reflux disease without esophagitis: Secondary | ICD-10-CM | POA: Insufficient documentation

## 2017-11-14 DIAGNOSIS — Z7951 Long term (current) use of inhaled steroids: Secondary | ICD-10-CM | POA: Insufficient documentation

## 2017-11-14 DIAGNOSIS — I10 Essential (primary) hypertension: Secondary | ICD-10-CM | POA: Insufficient documentation

## 2017-11-14 DIAGNOSIS — M1990 Unspecified osteoarthritis, unspecified site: Secondary | ICD-10-CM | POA: Insufficient documentation

## 2017-11-14 DIAGNOSIS — M109 Gout, unspecified: Secondary | ICD-10-CM | POA: Diagnosis not present

## 2017-11-14 DIAGNOSIS — E119 Type 2 diabetes mellitus without complications: Secondary | ICD-10-CM | POA: Diagnosis not present

## 2017-11-14 DIAGNOSIS — Z79899 Other long term (current) drug therapy: Secondary | ICD-10-CM | POA: Diagnosis not present

## 2017-11-14 LAB — CBC
HCT: 39.3 % (ref 39.0–52.0)
HEMOGLOBIN: 12.5 g/dL — AB (ref 13.0–17.0)
MCH: 30.1 pg (ref 26.0–34.0)
MCHC: 31.8 g/dL (ref 30.0–36.0)
MCV: 94.7 fL (ref 78.0–100.0)
Platelets: 255 10*3/uL (ref 150–400)
RBC: 4.15 MIL/uL — AB (ref 4.22–5.81)
RDW: 14.1 % (ref 11.5–15.5)
WBC: 5.7 10*3/uL (ref 4.0–10.5)

## 2017-11-14 LAB — APTT: aPTT: 29 seconds (ref 24–36)

## 2017-11-14 LAB — GLUCOSE, CAPILLARY: Glucose-Capillary: 89 mg/dL (ref 65–99)

## 2017-11-14 LAB — PROTIME-INR
INR: 0.98
Prothrombin Time: 12.9 seconds (ref 11.4–15.2)

## 2017-11-14 MED ORDER — LIDOCAINE HCL (PF) 1 % IJ SOLN
INTRAMUSCULAR | Status: AC
  Filled 2017-11-14: qty 30

## 2017-11-14 MED ORDER — HYDROCODONE-ACETAMINOPHEN 5-325 MG PO TABS: 1.0000 | ORAL_TABLET | ORAL | Status: DC | PRN

## 2017-11-14 MED ORDER — MIDAZOLAM HCL 2 MG/2ML IJ SOLN
INTRAMUSCULAR | Status: AC
  Filled 2017-11-14: qty 2

## 2017-11-14 MED ORDER — FENTANYL CITRATE (PF) 100 MCG/2ML IJ SOLN
INTRAMUSCULAR | Status: AC
  Filled 2017-11-14: qty 2

## 2017-11-14 MED ORDER — SODIUM CHLORIDE 0.9 % IV SOLN: INTRAVENOUS | Status: DC

## 2017-11-14 NOTE — Sedation Documentation (Signed)
Patient requesting no sedation to start the procedure. Patient informed staff he will let staff know if he needs something for pain/sedation during procedure. MD aware.

## 2017-11-14 NOTE — H&P (Addendum)
Chief Complaint: Patient was seen in consultation today for parotid mass  Referring Physician(s): Eksir,Samantha A  Supervising Physician: Oley Balm  Patient Status: [facility] - Out-pt  History of Present Illness: Jeffery Friedman is a 72 y.o. male with past medical history of arhtritis, DM, GERD, HTN who recently noted a lump in his right cheek near the ear.   CT Soft Tissue Neck 10/31/17 showed: 3 x 3.5 cm mass lesion of the superficial lobe of the right parotid. Fairly low-density lesion, similar to the other parotid tissue. Whereas this could represent a cyst, necrotic tumor is not excluded. I would suggest ultrasound with consideration of biopsy.  IR consulted for parotid mass biopsy at the request of Dr. Gaspar Skeeters. Case reviewed and approved by Dr. Lowella Dandy.   Patient presents to radiology department in his usual state of health.  He has been NPO.  He does not take blood thinners.    Past Medical History:  Diagnosis Date  . Arthritis   . Arthritis of knee, right   . Arthritis of right hip    End Stage  . Diabetes mellitus without complication (HCC)    Type II  . GERD (gastroesophageal reflux disease)   . Gout   . Hypertension   . Primary localized osteoarthritis of right hip 09/16/2017  . Sleep apnea    Uses CPAP    Past Surgical History:  Procedure Laterality Date  . APPENDECTOMY    . SHOULDER ARTHROSCOPY W/ ROTATOR CUFF REPAIR     Right  . TONSILLECTOMY    . 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 Authorizing Provider  allopurinol (ZYLOPRIM) 300 MG tablet Take 300 mg by mouth daily.   Yes [provider]  amLODipine (NORVASC) 5 MG tablet Take 5 mg by mouth at bedtime.    Yes [provider]  azelastine (ASTELIN) 137 MCG/SPRAY nasal spray Place 1 spray into both nostrils 2 (two) times daily.    Yes [provider]  azelastine (OPTIVAR) 0.05 % ophthalmic solution Place 1 drop into both eyes 2 (two) times daily as needed (for allergy relief).   Yes [provider]  baclofen (LIORESAL) 10 MG tablet Take 1 tablet (10 mg total) by mouth 3 (three) times daily. As needed for muscle spasm 09/16/17  Yes Teryl Lucy, MD  benazepril (LOTENSIN) 40 MG tablet Take 40 mg by mouth at bedtime.    Yes [provider]  docusate sodium (COLACE) 100 MG capsule Take 100 mg by mouth daily.   Yes [provider]  FIBER ADULT GUMMIES PO Take 2 each by mouth daily.   Yes [provider]  fluticasone (FLONASE) 50 MCG/ACT nasal spray Place 1 spray in each nostril in the morning 06/20/15  Yes [provider]  levocetirizine (XYZAL) 5 MG tablet Take 5 mg by mouth every evening.   Yes [provider]  metFORMIN (GLUCOPHAGE) 500 MG tablet Take 500 mg by mouth daily with breakfast.    Yes [provider]  montelukast (SINGULAIR) 10 MG tablet Take 10 mg by mouth at bedtime.   Yes [provider]  pantoprazole (PROTONIX) 40 MG tablet Take 40 mg by mouth daily 01/15/16  Yes [provider]  rosuvastatin (CRESTOR) 20 MG tablet Take 10 mg  by mouth every other day. Take 10 mg by mouth every other night   Yes [provider]     Family History  Problem Relation Age of Onset  . Lymphoma Mother   . Heart attack Father     Social History   Socioeconomic History  . Marital status: Married    Spouse name: Not on file  . Number of children: Not on file  . Years of education: Not on file  . Highest education level: Not on file  Occupational History  . Not on file  Social Needs  . Financial resource strain: Not on file  . Food insecurity:    Worry: Not on file    Inability: Not on file  .  Transportation needs:    Medical: Not on file    Non-medical: Not on file  Tobacco Use  . Smoking status: Never Smoker  . Smokeless tobacco: Never Used  Substance and Sexual Activity  . Alcohol use: Yes    Alcohol/week: 2.4 oz    Types: 4 Shots of liquor per week    Comment: occasional  . Drug use: No  . Sexual activity: Yes    Partners: Female  Lifestyle  . Physical activity:    Days per week: Not on file    Minutes per session: Not on file  . Stress: Not on file  Relationships  . Social connections:    Talks on phone: Not on file    Gets together: Not on file    Attends religious service: Not on file    Active member of club or organization: Not on file    Attends meetings of clubs or organizations: Not on file    Relationship status: Not on file  Other Topics Concern  . Not on file  Social History Narrative  . Not on file     Review of Systems: A 12 point ROS discussed and pertinent positives are indicated in the HPI above.  All other systems are negative.  Review of Systems  Constitutional: Negative for fatigue and fever.  HENT: Positive for facial swelling (lump near right ear).   Respiratory: Negative for cough and shortness of breath.   Cardiovascular: Negative for chest pain.  Gastrointestinal: Negative for abdominal pain.  Musculoskeletal: Negative for back pain.  Psychiatric/Behavioral: Negative for behavioral problems and confusion.    Vital Signs: BP (!) 149/91   Pulse (!) 58   Temp 97.9 F (36.6 C) (Oral)   Resp 20   Ht  (1.803 m)   Wt 245 lb (111.1 kg)   SpO2 96%   BMI 34.17 kg/m   Physical Exam  Constitutional: He is oriented to person, place, and time. He appears well-developed.  HENT:  Palpable mass near right ear  Cardiovascular: Normal rate, regular rhythm and normal heart sounds.  Pulmonary/Chest: Effort normal and breath sounds normal. No respiratory distress.  Neurological: He is alert and oriented to person, place, and time.   Skin: Skin is warm and dry.  Psychiatric: He has a normal mood and affect. His behavior is normal. Judgment and thought content normal.  Nursing note and vitals reviewed.    MD Evaluation Airway: WNL Heart: WNL Abdomen: WNL Chest/ Lungs: WNL ASA  Classification: 3 Mallampati/Airway Score: One   Imaging: Ct Soft Tissue Neck W Contrast  Result Date: 10/31/2017 CLINICAL DATA:  Lump adjacent to the right ear noted 3 months duration. EXAM: CT NECK WITH CONTRAST TECHNIQUE: Multidetector CT imaging of the neck was  performed using the standard protocol following the bolus administration of intravenous contrast. CONTRAST:  75mL ISOVUE-300 IOPAMIDOL (ISOVUE-300) INJECTION 61% COMPARISON:  None. FINDINGS: Pharynx and larynx: No mucosal or submucosal lesion is seen. Possible mild paresis of the right vocal fold. Salivary glands: Submandibular glands are symmetric and normal. Left parotid gland is normal. Within the upper portion of the superficial lobe of the right parotid, there is a 3 x 3.5 cm mass. This is fairly low density, similar to the remainder of the parotid tissue. Consider ultrasound and biopsy. Thyroid: Normal Lymph nodes: No enlarged or low-density nodes on either side of the neck. Vascular: Patent and normal. Limited intracranial: Normal Visualized orbits: Not included Mastoids and visualized paranasal sinuses: Maxillary and sphenoid sinus regions are clear as visualized. Mastoids are minimally aerated. There is probably a mastoid effusion on the left. Skeleton: Ordinary mild cervical spondylosis. Likely incidental torus mandibularis. Upper chest: Negative Other: None IMPRESSION: 3 x 3.5 cm mass lesion of the superficial lobe of the right parotid. Fairly low-density lesion, similar to the other parotid tissue. Whereas this could represent a cyst, necrotic tumor is not excluded. I would suggest ultrasound with consideration of biopsy. Electronically Signed   By: Paulina Fusi M.D.   On:  10/31/2017 10:42    Labs:  CBC: Recent Labs    09/05/17 1228 09/16/17 1312 09/17/17 0524 09/18/17 0453 11/14/17 1157  WBC 5.7  --  10.4 10.5 5.7  HGB 14.3 12.3* 11.9* 10.3* 12.5*  HCT 43.9 38.8* 36.9* 31.8* 39.3  PLT 239  --  206 191 255    COAGS: Recent Labs    11/14/17 1157  INR 0.98  APTT 29    BMP: Recent Labs    09/05/17 1228 09/17/17 0524 09/18/17 0453  NA 139 139 138  K 4.2 3.8 4.3  CL 105 104 107  CO2 GLUCOSE 96 133* 123*  BUN CALCIUM 9.4 8.9 8.4*  CREATININE 1.16 1.43* 1.28*  GFRNONAA >60 48* 55*  GFRAA >60 55* >60    LIVER FUNCTION TESTS: No results for input(s): BILITOT, AST, ALT, ALKPHOS, PROT, ALBUMIN in the last 8760 hours.  TUMOR MARKERS: No results for input(s): AFPTM, CEA, CA199, CHROMGRNA in the last 8760 hours.  Assessment and Plan: Patient with past medical history of DM, GERD, HTN presents with complaint of parotid mass.  IR consulted for parotid mass biopsy at the request of Dr. Rene Kocher. Case reviewed by Dr. Lowella Dandy who approves patient for procedure.  Patient presents today in their usual state of health.  He has been NPO and is not currently on blood thinners.   Risks and benefits discussed with the patient including, but not limited to bleeding, infection, damage to adjacent structures or low yield requiring additional tests.  All of the patient's questions were answered, patient is agreeable to proceed. Consent signed and in chart.  Thank you for this interesting consult.  I greatly enjoyed meeting KRAIG GENIS and look forward to participating in their care.  A copy of this report was sent to the requesting provider on this date.  Electronically Signed: Hoyt Koch, PA 11/14/2017, 12:57 PM   I spent a total of  30 Minutes   in face to face in clinical consultation, greater than 50% of which was counseling/coordinating care for parotid mass.

## 2017-11-14 NOTE — Procedures (Signed)
  Procedure: [place] FNA R parotid lesion 10ml green/yellow fluid + 25g FNA x3 EBL:   minimal Complications:  none immediate  See full dictation in [employer].  Thora Lance MD Main # [phone] Pager  [phone]

## 2017-11-14 NOTE — Discharge Instructions (Signed)
Needle Biopsy, Care After °Refer to this sheet in the next few weeks. These instructions provide you with information about caring for yourself after your procedure. Your health care provider may also give you more specific instructions. Your treatment has been planned according to current medical practices, but problems sometimes occur. Call your health care provider if you have any problems or questions after your procedure. °What can I expect after the procedure? °After your procedure, it is common to have soreness, bruising, or mild pain at the biopsy site. This should go away in a few days. °Follow these instructions at home: °· Rest as directed by your health care provider. °· Take medicines only as directed by your health care provider. °· There are many different ways to close and cover the biopsy site, including stitches (sutures), skin glue, and adhesive strips. Follow your health care provider's instructions about: °? Biopsy site care. °? Bandage (dressing) changes and removal. °? Biopsy site closure removal. °· Check your biopsy site every day for signs of infection. Watch for: °? Redness, swelling, or pain. °? Fluid, blood, or pus. °Contact a health care provider if: °· You have a fever. °· You have redness, swelling, or pain at the biopsy site that lasts longer than a few days. °· You have fluid, blood, or pus coming from the biopsy site. °· You feel nauseous. °· You vomit. °Get help right away if: °· You have shortness of breath. °· You have trouble breathing. °· You have chest pain. °· You feel dizzy or you faint. °· You have bleeding that does not stop with pressure or a bandage. °· You cough up blood. °· You have pain in your abdomen. °This information is not intended to replace advice given to you by your health care provider. Make sure you discuss any questions you have with your health care provider. °Document Released: 10/25/2014 Document Revised: 11/16/2015 Document Reviewed:  06/06/2014 °Elsevier Interactive Patient Education © 2018 [employer] ° °

## 2018-02-02 ENCOUNTER — Encounter: Payer: Self-pay | Admitting: Internal Medicine

## 2018-02-03 ENCOUNTER — Ambulatory Visit (INDEPENDENT_AMBULATORY_CARE_PROVIDER_SITE_OTHER): Payer: Medicare Other | Admitting: Internal Medicine

## 2018-02-03 ENCOUNTER — Encounter: Payer: Self-pay | Admitting: Internal Medicine

## 2018-02-03 VITALS — BP 130/78 | HR 68 | Ht 73.0 in | Wt 241.8 lb

## 2018-02-03 DIAGNOSIS — G4733 Obstructive sleep apnea (adult) (pediatric): Secondary | ICD-10-CM | POA: Diagnosis not present

## 2018-02-03 DIAGNOSIS — M1611 Unilateral primary osteoarthritis, right hip: Secondary | ICD-10-CM

## 2018-02-03 NOTE — Assessment & Plan Note (Signed)
He reports great success with R THR by Dr Dion SaucierLandau. Joint pain is not disturbing sleep.

## 2018-02-03 NOTE — Progress Notes (Signed)
HPI male never smoker followed for OSA, omplicated by morbid obesity, DM 2, GERD, gout, HBP Sleep study 03/16/2008-[facility], AHI 66/  He was optimal on CPAP 12-13 centimeters water.  ------------------------------------------------------------------------ 02/03/17-72 year old male never smoker followed for OSA. Complicating medical problems include morbid obesity Pt states that his CPAP machine is still working good for him. Denies any complaints or concerns. CPAP auto 5-15/ [facility] Download- 100% compliance, AHI 2.3/hour. He averages 8 hours and 37 minutes usage each night. He doesn't want to sleep without CPAP and feels very much better using it. Nasal pillows mask. No concerns or changes requested.  02/03/2018- 72 year old male never smoker followed for OSA, omplicated by morbid obesity, DM 2, GERD, gout, HBP CPAP auto 5-15/[facility] -----OSA: DME [facility]. Pt wears CPAP machine nightly and DL attached. No new supplies needed at this time.  R THR in Feb, uncomplicated. No other medical issues to report. Sleeps much better with CPAP "Part of me now". Download 100% compliance, AHI 4.8/ hr.  ROS-see HPI    "+" = positive Constitutional:    weight loss, night sweats, fevers, chills, fatigue, lassitude. HEENT:    headaches, difficulty swallowing, tooth/dental problems, sore throat,       sneezing, itching, ear ache, nasal congestion, post nasal drip, snoring CV:    chest pain, orthopnea, PND, swelling in lower extremities, anasarca,                                                 dizziness, palpitations Resp:   shortness of breath with exertion or at rest.               productive cough,   non-productive cough, coughing up of blood.              change in color of mucus.  wheezing.   Skin:    rash or lesions. GI:  No-   heartburn, indigestion, abdominal pain, nausea, vomiting, diarrhea,                 change in bowel habits, loss of appetite GU: dysuria, change in color of urine, no  urgency or frequency.   flank pain. MS:   joint pain, stiffness, decreased range of motion, back pain. Neuro-     nothing unusual Psych:  change in mood or affect.  depression or anxiety.   memory loss.  OBJ- Physical Exam General- Alert, Oriented, Affect-appropriate, Distress- none acute, + overweight Skin- rash-none, lesions- none, excoriation- none Lymphadenopathy- none Head- atraumatic            Eyes- Gross vision intact, PERRLA, conjunctivae and secretions clear            Ears- Hearing, canals-normal            Nose- Clear, no-Septal dev, mucus, polyps, erosion, perforation             Throat- Mallampati IV , mucosa clear , drainage- none, tonsils- atrophic Neck- flexible , trachea midline, no stridor , thyroid nl, carotid no bruit Chest - symmetrical excursion , unlabored           Heart/CV- RRR , no murmur , no gallop  , no rub, nl s1 s2                           -  JVD- none , edema- none, stasis changes- none, varices- none           Lung- clear to P&A, wheeze- none, cough- none , dullness-none, rub- none           Chest wall-  Abd-  Br/ Gen/ Rectal- Not done, not indicated Extrem- cyanosis- none, clubbing, none, atrophy- none, strength- nl Neuro- grossly intact to observation   .

## 2018-02-03 NOTE — Assessment & Plan Note (Signed)
Doing very well with CPAP and continues to benefit with improved sleep. No concerns identified. He is working well with [facility]. Plan- continue CPAP auto 5-15

## 2018-02-03 NOTE — Patient Instructions (Signed)
We can continue CPAP auto 5-15, mask of choice, humidifier, supplies, AirView  Please call if we can help 

## 2018-03-31 DIAGNOSIS — M1711 Unilateral primary osteoarthritis, right knee: Secondary | ICD-10-CM | POA: Insufficient documentation

## 2019-01-19 IMAGING — CT CT NECK W/ CM
2 of 4 series · 6 of 14 positions shown, 7 images · IV contrast (iopamidol)
Comparison: None.

CLINICAL DATA: Lump adjacent to the right ear noted 3 months
duration.

EXAM:
CT NECK WITH CONTRAST
TECHNIQUE: Multidetector CT imaging of the neck was performed using the
standard protocol following the bolus administration of intravenous
contrast.
CONTRAST:  75mL 4TI4XJ-522 IOPAMIDOL (4TI4XJ-522) INJECTION 61%

[Series 2: neck · axial · 0.49mm/px · z∈[-240,-112]mm · 3 of 128 slices shown]
[im 32/128  bone]
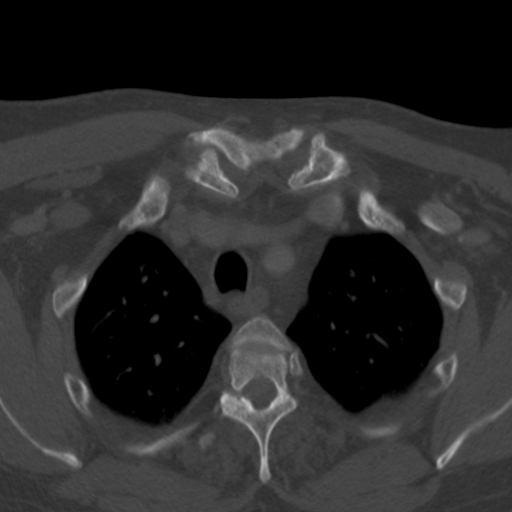
[im 64/128  bone]
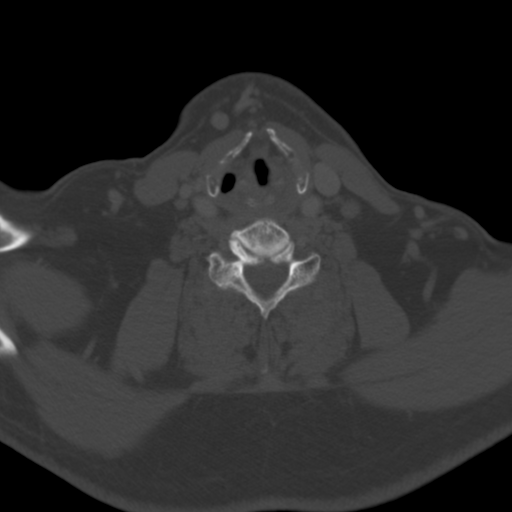
[im 96/128  bone]
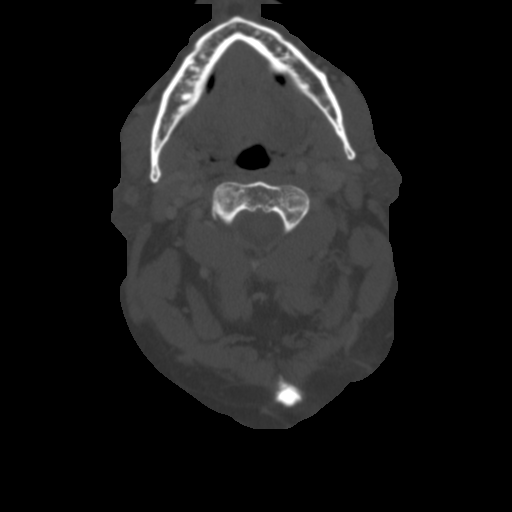

[Series 7: angled axial-oropharynx · axial · 0.39mm/px · z∈[-255,-134]mm · 3 of 125 slices shown, 4 images]
[im 32/125  soft-tissue]
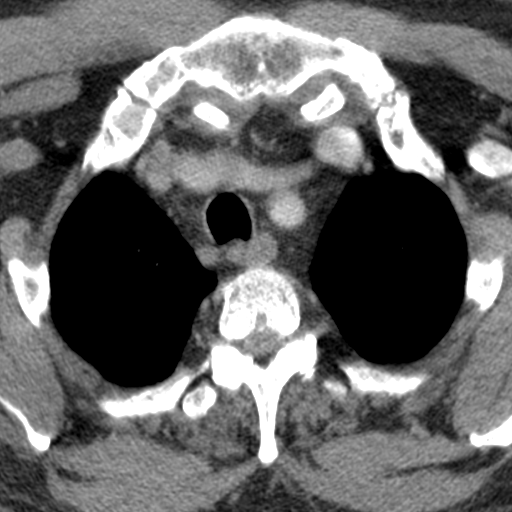
[im 32/125  bone]
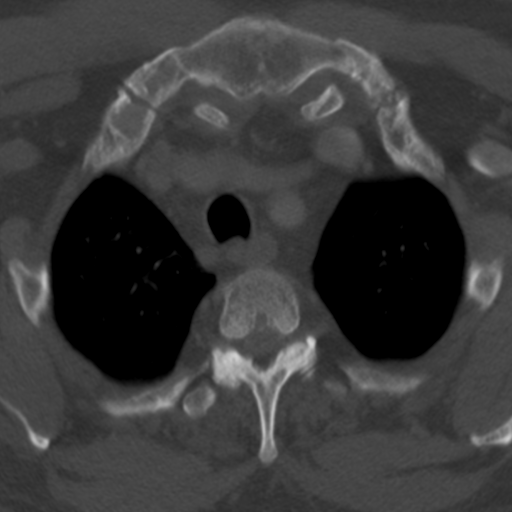
[im 63/125  bone]
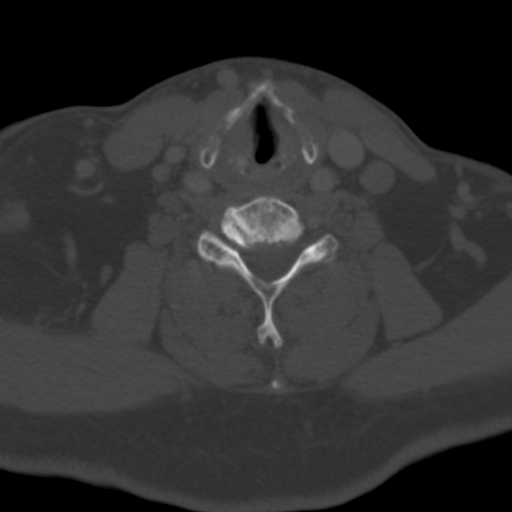
[im 94/125  bone]
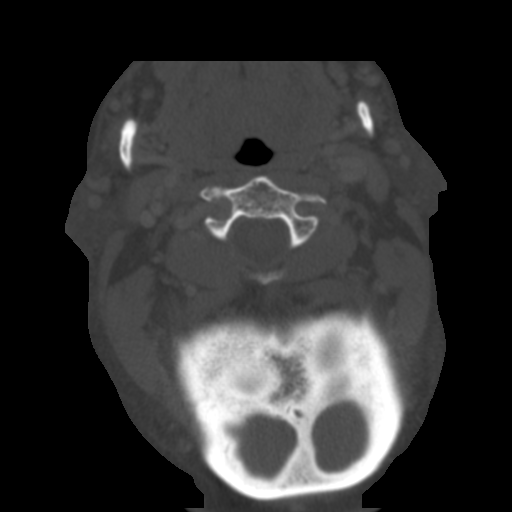

[6 of 14 positions shown; findings below may reference images not displayed]

FINDINGS: Pharynx and larynx: No mucosal or submucosal lesion is seen.
Possible mild paresis of the right vocal fold.

Salivary glands: Submandibular glands are symmetric and normal. Left
parotid gland is normal. Within the upper portion of the superficial
lobe of the right parotid, there is a 3 x 3.5 cm mass. This is
fairly low density, similar to the remainder of the parotid tissue.
Consider ultrasound and biopsy.

Thyroid: Normal

Lymph nodes: No enlarged or low-density nodes on either side of the
neck.

Vascular: Patent and normal.

Limited intracranial: Normal

Visualized orbits: Not included

Mastoids and visualized paranasal sinuses: Maxillary and sphenoid
sinus regions are clear as visualized. Mastoids are minimally
aerated. There is probably a mastoid effusion on the left.

Skeleton: Ordinary mild cervical spondylosis. Likely incidental
torus mandibularis.

Upper chest: Negative

Other: None
IMPRESSION: 3 x 3.5 cm mass lesion of the superficial lobe of the right parotid.
Fairly low-density lesion, similar to the other parotid tissue.
Whereas this could represent a cyst, necrotic tumor is not excluded.
I would suggest ultrasound with consideration of biopsy.

## 2019-03-18 ENCOUNTER — Telehealth: Payer: Self-pay | Admitting: Internal Medicine

## 2019-03-18 ENCOUNTER — Other Ambulatory Visit: Payer: Self-pay

## 2019-03-18 ENCOUNTER — Encounter: Payer: Self-pay | Admitting: Internal Medicine

## 2019-03-18 ENCOUNTER — Ambulatory Visit (INDEPENDENT_AMBULATORY_CARE_PROVIDER_SITE_OTHER): Payer: Medicare Other | Admitting: Internal Medicine

## 2019-03-18 VITALS — BP 150/92 | HR 55 | Temp 98.0°F | Ht 73.0 in | Wt 237.4 lb

## 2019-03-18 DIAGNOSIS — M1611 Unilateral primary osteoarthritis, right hip: Secondary | ICD-10-CM

## 2019-03-18 DIAGNOSIS — J3089 Other allergic rhinitis: Secondary | ICD-10-CM

## 2019-03-18 DIAGNOSIS — J302 Other seasonal allergic rhinitis: Secondary | ICD-10-CM

## 2019-03-18 DIAGNOSIS — G4733 Obstructive sleep apnea (adult) (pediatric): Secondary | ICD-10-CM | POA: Diagnosis not present

## 2019-03-18 NOTE — Progress Notes (Signed)
HPI male never smoker followed for OSA, omplicated by morbid obesity, DM 2, GERD, gout, HBP Sleep study 03/16/2008-[facility], AHI 66/  He was optimal on CPAP 12-13 centimeters water.  ------------------------------------------------------------------------  02/03/2018- 73 year old male never smoker followed for OSA, complicated by morbid obesity, DM 2, GERD, gout, HBP CPAP auto 5-15/[facility] -----OSA: DME [facility]. Pt wears CPAP machine nightly and DL attached. No new supplies needed at this time.  R THR in Feb, uncomplicated. No other medical issues to report. Sleeps much better with CPAP "Part of me now". Download 100% compliance, AHI 4.8/ hr.  03/18/2019- 73 year old male never smoker followed for OSA, complicated by morbid obesity, DM 2, GERD, gout, HBP, Rhinitis CPAP auto 5-15/[facility] -----OSA on CPAP, DME: [facility]; no complaints, pt states he does not need new supplies at this point Body weight today 237 lbs   Arrival BP 150/ 92 Download not available. Flu vax per PCP in October Life better off with CPAP. Machine is older than 5 years. Discussed replacing.  Allergic nose managed by [facility]. Seasonal pollens. Using mask to do yard work. We discussed meds. R THR with no respiratory problems.   ROS-see HPI    "+" = positive Constitutional:    weight loss, night sweats, fevers, chills, fatigue, lassitude. HEENT:    headaches, difficulty swallowing, tooth/dental problems, sore throat,       sneezing, itching, ear ache, nasal congestion, post nasal drip, snoring CV:    chest pain, orthopnea, PND, swelling in lower extremities, anasarca,                                                 dizziness, palpitations Resp:   shortness of breath with exertion or at rest.               productive cough,   non-productive cough, coughing up of blood.              change in color of mucus.  wheezing.   Skin:    rash or lesions. GI:  No-   heartburn, indigestion, abdominal pain, nausea,  vomiting, diarrhea,                 change in bowel habits, loss of appetite GU: dysuria, change in color of urine, no urgency or frequency.   flank pain. MS:   joint pain, stiffness, decreased range of motion, back pain. Neuro-     nothing unusual Psych:  change in mood or affect.  depression or anxiety.   memory loss.  OBJ- Physical Exam General- Alert, Oriented, Affect-appropriate, Distress- none acute, + obese Skin- rash-none, lesions- none, excoriation- none Lymphadenopathy- none Head- atraumatic            Eyes- Gross vision intact, PERRLA, conjunctivae and secretions clear            Ears- Hearing, canals-normal            Nose- Clear, no-Septal dev, mucus, polyps, erosion, perforation             Throat- Mallampati IV , mucosa clear , drainage- none, tonsils- atrophic Neck- flexible , trachea midline, no stridor , thyroid nl, carotid no bruit Chest - symmetrical excursion , unlabored           Heart/CV- RRR , no murmur , no gallop  , no rub, nl s1  s2                           - JVD- none , edema- none, stasis changes- none, varices- none           Lung- clear to P&A, wheeze- none, cough- none , dullness-none, rub- none           Chest wall-  Abd-  Br/ Gen/ Rectal- Not done, not indicated Extrem- cyanosis- none, clubbing, none, atrophy- none, strength- nl Neuro- grossly intact to observation   .

## 2019-03-18 NOTE — Assessment & Plan Note (Signed)
Tolerated R hip replacement. Had some post-op orthostasis by description, but no respiratory problems.

## 2019-03-18 NOTE — Assessment & Plan Note (Signed)
Benefits from CPAP. Compliance and control have been good. Plan- Replace old CPAP machine, auto 5-15

## 2019-03-18 NOTE — Telephone Encounter (Signed)
Verified that patient is indeed in [place].  Will sign off and let Annie Paras LPN know for Dr. Annamaria Boots.

## 2019-03-18 NOTE — Patient Instructions (Signed)
Order- DME [facility]/ [facility]- please replace old CPAP machine continue auto 5-15, mask of choice, humidifier, supplies, AirView/ card  Glad the allergy meds help  Please call if we can help.

## 2019-03-18 NOTE — Assessment & Plan Note (Signed)
Noted since [place]. Manages ok with antihistamines and nasal sprays. Followed by Dr [facility]

## 2019-08-08 ENCOUNTER — Ambulatory Visit: Payer: Medicare Other | Attending: Internal Medicine

## 2019-08-08 DIAGNOSIS — Z23 Encounter for immunization: Secondary | ICD-10-CM | POA: Insufficient documentation

## 2019-08-08 NOTE — Progress Notes (Signed)
   Covid-19 Vaccination Clinic  Name:  KHYLON DAVIES    MRN: [id number] DOB: 07-01-1945  08/08/2019  Mr. Allemand was observed post Covid-19 immunization for 15 minutes without incidence. He was provided with Vaccine Information Sheet and instruction to access the V-Safe system.   Mr. Dimartino was instructed to call 911 with any severe reactions post vaccine: Marland Kitchen Difficulty breathing  . Swelling of your face and throat  . A fast heartbeat  . A bad rash all over your body  . Dizziness and weakness    Immunizations Administered    Name Date Dose VIS Date Route   Pfizer COVID-19 Vaccine 08/08/2019  9:23 AM 0.3 mL 06/04/2019 Intramuscular   Manufacturer: [employer], [employer]   Lot: [id number]   NDC: [address]-4

## 2019-08-31 ENCOUNTER — Ambulatory Visit: Payer: Medicare Other | Attending: Internal Medicine

## 2019-08-31 DIAGNOSIS — Z23 Encounter for immunization: Secondary | ICD-10-CM | POA: Insufficient documentation

## 2019-08-31 NOTE — Progress Notes (Signed)
   Covid-19 Vaccination Clinic  Name:  Jeffery Friedman    MRN: [id number] DOB: 1945/10/21  08/31/2019  Mr. Oliff was observed post Covid-19 immunization for 15 minutes without incident. He was provided with Vaccine Information Sheet and instruction to access the V-Safe system.   Mr. Venson was instructed to call 911 with any severe reactions post vaccine: Marland Kitchen Difficulty breathing  . Swelling of face and throat  . A fast heartbeat  . A bad rash all over body  . Dizziness and weakness   Immunizations Administered    Name Date Dose VIS Date Route   Pfizer COVID-19 Vaccine 08/31/2019  8:56 AM 0.3 mL 06/04/2019 Intramuscular   Manufacturer: [employer], [employer]   Lot: [id number]   NDC: [address]-2

## 2020-03-17 ENCOUNTER — Other Ambulatory Visit: Payer: Self-pay

## 2020-03-17 ENCOUNTER — Ambulatory Visit (INDEPENDENT_AMBULATORY_CARE_PROVIDER_SITE_OTHER): Payer: Medicare Other | Admitting: Internal Medicine

## 2020-03-17 ENCOUNTER — Encounter: Payer: Self-pay | Admitting: Internal Medicine

## 2020-03-17 DIAGNOSIS — Z6835 Body mass index (BMI) 35.0-35.9, adult: Secondary | ICD-10-CM

## 2020-03-17 DIAGNOSIS — G4733 Obstructive sleep apnea (adult) (pediatric): Secondary | ICD-10-CM | POA: Diagnosis not present

## 2020-03-17 DIAGNOSIS — E66812 Obesity, class 2: Secondary | ICD-10-CM

## 2020-03-17 NOTE — Progress Notes (Signed)
HPI male never smoker followed for OSA, omplicated by morbid obesity, DM 2, GERD, gout, HBP Sleep study 03/16/2008-[facility], AHI 66/  He was optimal on CPAP 12-13 centimeters water.  ------------------------------------------------------------------------   03/18/2019- 74 year old male never smoker followed for OSA, complicated by morbid obesity, DM 2, GERD, gout, HBP, Rhinitis CPAP auto 5-15/[facility] -----OSA on CPAP, DME: [facility]; no complaints, pt states he does not need new supplies at this point Body weight today 237 lbs   Arrival BP 150/ 92 Download not available. Flu vax per PCP in October Life better off with CPAP. Machine is older than 5 years. Discussed replacing.  Allergic nose managed by [facility]. Seasonal pollens. Using mask to do yard work. We discussed meds. R THR with no respiratory problems.   03/17/20- 74 year old male never smoker followed for OSA, complicated by morbid obesity, DM 2, GERD, gout, HBP, Rhinitis CPAP auto 5-15/[facility] Download- compliance 100%, AHI 2.6/ hfr Body weight today- 246 lbs Covid vax- 2 Phizer Flu vax- plans in Oct -----osa,denies problems with cpap, feels rested  Reviewed download. He feels he is doing quite well with no concerns about OSA or general health. Discussed vaccinations.   ROS-see HPI    "+" = positive Constitutional:    weight loss, night sweats, fevers, chills, fatigue, lassitude. HEENT:    headaches, difficulty swallowing, tooth/dental problems, sore throat,       sneezing, itching, ear ache, nasal congestion, post nasal drip, snoring CV:    chest pain, orthopnea, PND, swelling in lower extremities, anasarca,                                                  dizziness, palpitations Resp:   shortness of breath with exertion or at rest.               productive cough,   non-productive cough, coughing up of blood.              change in color of mucus.  wheezing.   Skin:    rash or lesions. GI:  No-   heartburn,  indigestion, abdominal pain, nausea, vomiting, diarrhea,                 change in bowel habits, loss of appetite GU: dysuria, change in color of urine, no urgency or frequency.   flank pain. MS:   joint pain, stiffness, decreased range of motion, back pain. Neuro-     nothing unusual Psych:  change in mood or affect.  depression or anxiety.   memory loss.  OBJ- Physical Exam General- Alert, Oriented, Affect-appropriate, Distress- none acute, + obese Skin- rash-none, lesions- none, excoriation- none Lymphadenopathy- none Head- atraumatic            Eyes- Gross vision intact, PERRLA, conjunctivae and secretions clear            Ears- Hearing, canals-normal            Nose- Clear, no-Septal dev, mucus, polyps, erosion, perforation             Throat- Mallampati IV , mucosa clear , drainage- none, tonsils- atrophic Neck- flexible , trachea midline, no stridor , thyroid nl, carotid no bruit Chest - symmetrical excursion , unlabored           Heart/CV- RRR , no murmur , no gallop  ,  no rub, nl s1 s2                           - JVD- none , edema- none, stasis changes- none, varices- none           Lung- clear to P&A, wheeze- none, cough- none , dullness-none, rub- none           Chest wall-  Abd-  Br/ Gen/ Rectal- Not done, not indicated Extrem- cyanosis- none, clubbing, none, atrophy- none, strength- nl Neuro- grossly intact to observation   .

## 2020-03-17 NOTE — Patient Instructions (Signed)
We cn continue CPAP auto 5-15  Glad you are doing well  Please call if we can help

## 2020-03-20 ENCOUNTER — Encounter: Payer: Self-pay | Admitting: Internal Medicine

## 2020-03-20 NOTE — Assessment & Plan Note (Signed)
Benefits from CPAP with good compliance and control Plan- continue auto 5-15 

## 2020-03-20 NOTE — Assessment & Plan Note (Signed)
Continue to encourage diet and exercise toward weight loss. Limitation due to arthritis recognized.

## 2020-05-09 DIAGNOSIS — Z96652 Presence of left artificial knee joint: Secondary | ICD-10-CM | POA: Insufficient documentation

## 2020-07-06 ENCOUNTER — Encounter: Payer: Self-pay | Admitting: Podiatry

## 2020-07-06 ENCOUNTER — Other Ambulatory Visit: Payer: Self-pay | Admitting: Podiatry

## 2020-07-06 ENCOUNTER — Ambulatory Visit (INDEPENDENT_AMBULATORY_CARE_PROVIDER_SITE_OTHER): Payer: Medicare Other

## 2020-07-06 ENCOUNTER — Other Ambulatory Visit: Payer: Self-pay

## 2020-07-06 ENCOUNTER — Ambulatory Visit (INDEPENDENT_AMBULATORY_CARE_PROVIDER_SITE_OTHER): Payer: Medicare Other | Admitting: Podiatry

## 2020-07-06 DIAGNOSIS — G5761 Lesion of plantar nerve, right lower limb: Secondary | ICD-10-CM | POA: Diagnosis not present

## 2020-07-06 DIAGNOSIS — J3081 Allergic rhinitis due to animal (cat) (dog) hair and dander: Secondary | ICD-10-CM | POA: Insufficient documentation

## 2020-07-06 DIAGNOSIS — G5762 Lesion of plantar nerve, left lower limb: Secondary | ICD-10-CM

## 2020-07-06 DIAGNOSIS — J301 Allergic rhinitis due to pollen: Secondary | ICD-10-CM | POA: Insufficient documentation

## 2020-07-06 DIAGNOSIS — M79671 Pain in right foot: Secondary | ICD-10-CM

## 2020-07-06 DIAGNOSIS — J309 Allergic rhinitis, unspecified: Secondary | ICD-10-CM | POA: Insufficient documentation

## 2020-07-06 DIAGNOSIS — M79672 Pain in left foot: Secondary | ICD-10-CM

## 2020-07-06 DIAGNOSIS — H1045 Other chronic allergic conjunctivitis: Secondary | ICD-10-CM | POA: Insufficient documentation

## 2020-07-06 NOTE — Progress Notes (Signed)
Subjective:  Patient ID: Jeffery Friedman, male    DOB: 1945/08/07,  MRN: [id number]  Chief Complaint  Patient presents with  . Neuroma    Diagnosis of bilateral neuroma, right is non-painful, left neuroma causes pain plantar forefoot sub 2-5 digits. Pt states neuroma is agitated during physical therapy, a sharp pain running down posterior calf into neuroma site.  . Plantar Fasciitis    Diagnosis of plantar fasciitis. Pt states it does not bother him when he wears orthotics, however they are 75 years old and may need updating/replacing.    75 y.o. male presents with the above complaint.  Patient presents with complaint of bilateral Morton's neuroma with left greater than right side.  Patient states that they have been bothering him because of the hip surgery that he had.  He has been doing physical therapy was causing him to get sharp pain running down the back part of the calf into the neuroma site.  Patient states it is painful to walk on it.  He states this is still very mild and is very controllable.  He just wants to know if there is anything that can be done.  He has been wearing orthotics and wants to know that if he can make any adjustments the greater than 68 years old.   Review of Systems: Negative except as noted in the HPI. Denies N/V/F/Ch.  Past Medical History:  Diagnosis Date  . Arthritis   . Arthritis of knee, right   . Arthritis of right hip    End Stage  . Diabetes mellitus without complication ([place])    Type II  . GERD (gastroesophageal reflux disease)   . Gout   . Hypertension   . Primary localized osteoarthritis of right hip 09/16/2017  . Sleep apnea    Uses CPAP    Current Outpatient Medications:  .  allopurinol (ZYLOPRIM) 300 MG tablet, Take 300 mg by mouth daily., Disp: , Rfl:  .  amLODipine (NORVASC) 10 MG tablet, Take 10 mg by mouth daily., Disp: , Rfl:  .  ASPIRIN 81 PO, Take 1 tablet by mouth daily., Disp: , Rfl:  .  azelastine (ASTELIN) 137 MCG/SPRAY  nasal spray, Place 1 spray into both nostrils 2 (two) times daily. , Disp: , Rfl:  .  azelastine (OPTIVAR) 0.05 % ophthalmic solution, Place 1 drop into both eyes 2 (two) times daily as needed (for allergy relief)., Disp: , Rfl:  .  benazepril (LOTENSIN) 40 MG tablet, Take 40 mg by mouth at bedtime. , Disp: , Rfl:  .  clobetasol cream (TEMOVATE) 0.05 %, clobetasol 0.05 % topical cream  TWICE A DAY, Disp: , Rfl:  .  EPINEPHrine 0.3 mg/0.3 mL IJ SOAJ injection, USE AS DIRECTED FOR SYSTEMIC REACTION, Disp: , Rfl:  .  FIBER ADULT GUMMIES PO, Take 2 each by mouth daily., Disp: , Rfl:  .  fluticasone (FLONASE) 50 MCG/ACT nasal spray, Place 1 spray in each nostril in the morning, Disp: , Rfl:  .  levocetirizine (XYZAL) 5 MG tablet, Take 5 mg by mouth every evening., Disp: , Rfl:  .  metFORMIN (GLUCOPHAGE) 500 MG tablet, Take 500 mg by mouth daily with breakfast. , Disp: , Rfl:  .  methocarbamol (ROBAXIN) 500 MG tablet, methocarbamol 500 mg tablet, Disp: , Rfl:  .  montelukast (SINGULAIR) 10 MG tablet, Take 10 mg by mouth at bedtime., Disp: , Rfl:  .  ondansetron (ZOFRAN) 4 MG tablet, Take by mouth., Disp: , Rfl:  .  oxyCODONE (OXY IR/ROXICODONE) 5 MG immediate release tablet, oxycodone 5 mg tablet, Disp: , Rfl:  .  pantoprazole (PROTONIX) 40 MG tablet, Take 40 mg by mouth daily, Disp: , Rfl:  .  rosuvastatin (CRESTOR) 20 MG tablet, Take 10 mg by mouth every other day. Take 10 mg by mouth every other night, Disp: , Rfl:  .  senna-docusate (SENOKOT-S) 8.6-50 MG tablet, Senna Plus 8.6 mg-50 mg tablet  TAKE 2 TABLETS BY MOUTH EVERY DAY, Disp: , Rfl:  .  Zoster Vaccine Adjuvanted (SHINGRIX) injection, Shingrix (PF) 50 mcg/0.5 mL intramuscular suspension, kit, Disp: , Rfl:   Social History   Tobacco Use  Smoking Status Never Smoker  Smokeless Tobacco Never Used    Allergies  Allergen Reactions  . Hydrocodone-Homatropine Other (See Comments)    "loopy"   Objective:  There were no vitals filed for  this visit. There is no height or weight on file to calculate BMI. Constitutional Well developed. Well nourished.  Vascular Dorsalis pedis pulses palpable bilaterally. Posterior tibial pulses palpable bilaterally. Capillary refill normal to all digits.  No cyanosis or clubbing noted. Pedal hair growth normal.  Neurologic Normal speech. Oriented to person, place, and time. Epicritic sensation to light touch grossly present bilaterally.  Dermatologic Nails well groomed and normal in appearance. No open wounds. No skin lesions.  Orthopedic:  Positive Tinel's sign noted to bilateral third interspace consistent with Morton's neuroma.  Positive Mulder's click noted.  Negative pain with range of motion of the third and fourth metatarsophalangeal joint bilaterally   Radiographs: 3 views of skeletally mature adult bilateral foot: There is decreasing calcaneal inclination angle increasing talar declination angle.  Mild anterior break in the cyma line midfoot arthrosis noted.  Metatarsal parabola is intact. Assessment:   1. Morton's neuroma of right foot   2. Morton's neuroma of left foot    Plan:  Patient was evaluated and treated and all questions answered.  Bilateral Morton's neuroma left greater than right -I explained to the patient the etiology of Morton's neuroma versus treatment options were discussed.  At this time patient's pain is very mild in nature I will hold off on any type of injection.  The likely aggravating factor is part of physical therapy and once he has completed physical therapy I imagine the foot go back to normal without any pain.  Patient agrees with the plan.  Pes planovalgus -I explained the patient the etiology of pes planovalgus with this relationship with Morton's neuroma and history of plantar fasciitis and various treatment options were discussed.  He has orthotics in place that seems to be functioning well however patient would benefit from updating.  He will be  scheduled see rec for custom-made orthotics possibly update versus new pair.  No follow-ups on file.

## 2020-07-07 ENCOUNTER — Ambulatory Visit (INDEPENDENT_AMBULATORY_CARE_PROVIDER_SITE_OTHER): Payer: Medicare Other | Admitting: Orthotics

## 2020-07-07 DIAGNOSIS — G5762 Lesion of plantar nerve, left lower limb: Secondary | ICD-10-CM | POA: Diagnosis not present

## 2020-07-07 DIAGNOSIS — G5761 Lesion of plantar nerve, right lower limb: Secondary | ICD-10-CM | POA: Diagnosis not present

## 2020-07-07 NOTE — Progress Notes (Signed)
Cast today for f/o to address b/l morton neuroma; cut out and met pad to take pressure off of neurma.

## 2020-08-07 ENCOUNTER — Ambulatory Visit: Payer: Medicare Other | Admitting: Orthotics

## 2020-08-07 ENCOUNTER — Other Ambulatory Visit: Payer: Self-pay

## 2020-08-07 DIAGNOSIS — G5761 Lesion of plantar nerve, right lower limb: Secondary | ICD-10-CM

## 2020-08-07 DIAGNOSIS — G5762 Lesion of plantar nerve, left lower limb: Secondary | ICD-10-CM

## 2020-08-07 NOTE — Progress Notes (Signed)
Patient picked up f/o and was pleased with fit, comfort, and function.  Worked well with footwear.  Told of rbeak in period and how to report any issues.  

## 2021-03-15 NOTE — Progress Notes (Signed)
HPI male never smoker followed for OSA, omplicated by morbid obesity, DM 2, GERD, gout, HBP Sleep study 03/16/2008-[facility], AHI 66/  He was optimal on CPAP 12-13 centimeters water.  ------------------------------------------------------------------------   03/17/20- 75 year old male never smoker followed for OSA, complicated by morbid obesity, DM 2, GERD, gout, HBP, Rhinitis CPAP auto 5-15/[facility] Download- compliance 100%, AHI 2.6/ hfr Body weight today- 246 lbs Covid vax- 2 Phizer Flu vax- plans in Oct -----osa,denies problems with cpap, feels rested  Reviewed download. He feels he is doing quite well with no concerns about OSA or general health. Discussed vaccinations.  03/19/21- 75 year old male never smoker followed for OSA, complicated by morbid obesity, DM 2, GERD, gout, HBP, Rhinitis(Dr [place] Callas), CPAP auto 5-15/[facility] Download- N/A Body weight today-247 lbs Covid vax-4 Phizer Doing very well w CPAP- can't sleep without it. Machine about 75 yrs old- discussed replacement.  He will discuss timing for Flu vax and updated Phizer booster with his PCP. Our immunization file already lists 7 Covid vax= incorrect.  Had L TKR w/o problems.  ROS-see HPI    "+" = positive Constitutional:    weight loss, night sweats, fevers, chills, fatigue, lassitude. HEENT:    headaches, difficulty swallowing, tooth/dental problems, sore throat,       sneezing, itching, ear ache, nasal congestion, post nasal drip, snoring CV:    chest pain, orthopnea, PND, swelling in lower extremities, anasarca,                                                  dizziness, palpitations Resp:   shortness of breath with exertion or at rest.               productive cough,   non-productive cough, coughing up of blood.              change in color of mucus.  wheezing.   Skin:    rash or lesions. GI:  No-   heartburn, indigestion, abdominal pain, nausea, vomiting, diarrhea,                 change in bowel habits, loss  of appetite GU: dysuria, change in color of urine, no urgency or frequency.   flank pain. MS:   joint pain, stiffness, decreased range of motion, back pain. Neuro-     nothing unusual Psych:  change in mood or affect.  depression or anxiety.   memory loss.  OBJ- Physical Exam General- Alert, Oriented, Affect-appropriate, Distress- none acute, + obese Skin- rash-none, lesions- none, excoriation- none Lymphadenopathy- none Head- atraumatic            Eyes- Gross vision intact, PERRLA, conjunctivae and secretions clear            Ears- Hearing, canals-normal            Nose- Clear, no-Septal dev, mucus, polyps, erosion, perforation             Throat- Mallampati IV , mucosa clear , drainage- none, tonsils- atrophic Neck- flexible , trachea midline, no stridor , thyroid nl, carotid no bruit Chest - symmetrical excursion , unlabored           Heart/CV- RRR , no murmur , no gallop  , no rub, nl s1 s2                           -  JVD- none , edema- none, stasis changes- none, varices- none           Lung- clear to P&A, wheeze- none, cough- none , dullness-none, rub- none           Chest wall-  Abd-  Br/ Gen/ Rectal- Not done, not indicated Extrem- cyanosis- none, clubbing, none, atrophy- none, strength- nl Neuro- grossly intact to observation   .

## 2021-03-19 ENCOUNTER — Other Ambulatory Visit: Payer: Self-pay

## 2021-03-19 ENCOUNTER — Ambulatory Visit (INDEPENDENT_AMBULATORY_CARE_PROVIDER_SITE_OTHER): Payer: Medicare Other | Admitting: Internal Medicine

## 2021-03-19 ENCOUNTER — Encounter: Payer: Self-pay | Admitting: Internal Medicine

## 2021-03-19 VITALS — BP 148/78 | HR 66 | Temp 98.0°F | Ht 73.0 in | Wt 247.0 lb

## 2021-03-19 DIAGNOSIS — G4733 Obstructive sleep apnea (adult) (pediatric): Secondary | ICD-10-CM | POA: Diagnosis not present

## 2021-03-19 DIAGNOSIS — J301 Allergic rhinitis due to pollen: Secondary | ICD-10-CM

## 2021-03-19 NOTE — Patient Instructions (Addendum)
Order- DME [facility] please  replace old CPAP machine auto 5-15, mask of choice, humidifier, supplies, AirView/ card  Please call if we can help

## 2021-03-19 NOTE — Assessment & Plan Note (Signed)
Pleased with his CPAP. Sleeps better with it. Thinks machine is 75 years old. Discussed replacement/ supply chain. Plan- replace auto 5-15

## 2021-03-19 NOTE — Assessment & Plan Note (Signed)
Managed by [facility] allergist. Nasal congestion not interfering with CPAP use.

## 2021-04-07 LAB — COLOGUARD: COLOGUARD: POSITIVE — AB

## 2021-06-11 ENCOUNTER — Telehealth: Payer: Self-pay | Admitting: Internal Medicine

## 2021-06-12 NOTE — Telephone Encounter (Signed)
Call returned, made aware OV notes have been faxed.   Nothing further needed at this time.

## 2022-06-07 ENCOUNTER — Encounter: Payer: Self-pay | Admitting: Internal Medicine

## 2022-06-07 ENCOUNTER — Ambulatory Visit (INDEPENDENT_AMBULATORY_CARE_PROVIDER_SITE_OTHER): Payer: Medicare Other | Admitting: Internal Medicine

## 2022-06-07 VITALS — BP 130/80 | HR 69 | Ht 73.0 in | Wt 245.6 lb

## 2022-06-07 DIAGNOSIS — G4733 Obstructive sleep apnea (adult) (pediatric): Secondary | ICD-10-CM | POA: Diagnosis not present

## 2022-06-07 DIAGNOSIS — J301 Allergic rhinitis due to pollen: Secondary | ICD-10-CM | POA: Diagnosis not present

## 2022-06-07 NOTE — Patient Instructions (Signed)
Glad you are dong well with your new CPAP. We can continue auto 5-15  Please call if we can help- hope you have a Merry Christmas !

## 2022-06-07 NOTE — Assessment & Plan Note (Signed)
No obvious seasonal triggers of importance currently.  Rhinitis seems fairly well-controlled is followed by his allergist.

## 2022-06-07 NOTE — Progress Notes (Signed)
HPI male never smoker followed for OSA, omplicated by morbid obesity, DM 2, GERD, gout, HBP Sleep study 03/16/2008-[facility], AHI 66/  He was optimal on CPAP 12-13 centimeters water.  ------------------------------------------------------------------------   03/19/21- 76 year old male never smoker followed for OSA, complicated by morbid obesity, DM 2, GERD, gout, HBP, Rhinitis(Dr [place] Callas), CPAP auto 5-15/[facility] Download- N/A Body weight today-247 lbs Covid vax-4 Phizer Doing very well w CPAP- can't sleep without it. Machine about 76 yrs old- discussed replacement.  He will discuss timing for Flu vax and updated Phizer booster with his PCP. Our immunization file already lists 7 Covid vax= incorrect.  Had L TKR w/o problems.  06/07/22-  76 year old male never smoker followed for OSA, complicated by morbid obesity, DM 2, GERD, gout, HBP, Rhinitis(Dr [place] Callas), CPAP auto 5-15/[facility]  [email or web address]     new 2023 Download compliance- 100%, AHI 1.8/ hr Body weight today-245 lbs Covid vax-6 Phizer Flu vax-had RSV vax- had -----Pt is doing well. Pt states he got a new machine and it is working well He notes smaller AirSense 11 has smaller reservoir. Discussed adjusting humidifier so it will last the night. Breathing well.  ROS-see HPI    "+" = positive Constitutional:    weight loss, night sweats, fevers, chills, fatigue, lassitude. HEENT:    headaches, difficulty swallowing, tooth/dental problems, sore throat,       sneezing, itching, ear ache, nasal congestion, post nasal drip, snoring CV:    chest pain, orthopnea, PND, swelling in lower extremities, anasarca,                                                  dizziness, palpitations Resp:   shortness of breath with exertion or at rest.               productive cough,   non-productive cough, coughing up of blood.              change in color of mucus.  wheezing.   Skin:    rash or lesions. GI:  No-   heartburn, indigestion, abdominal pain,  nausea, vomiting, diarrhea,                 change in bowel habits, loss of appetite GU: dysuria, change in color of urine, no urgency or frequency.   flank pain. MS:   joint pain, stiffness, decreased range of motion, back pain. Neuro-     nothing unusual Psych:  change in mood or affect.  depression or anxiety.   memory loss.  OBJ- Physical Exam General- Alert, Oriented, Affect-appropriate, Distress- none acute, + obese Skin- rash-none, lesions- none, excoriation- none Lymphadenopathy- none Head- atraumatic            Eyes- Gross vision intact, PERRLA, conjunctivae and secretions clear            Ears- Hearing, canals-normal            Nose- Clear, no-Septal dev, mucus, polyps, erosion, perforation             Throat- Mallampati IV , mucosa clear , drainage- none, tonsils- atrophic Neck- flexible , trachea midline, no stridor , thyroid nl, carotid no bruit Chest - symmetrical excursion , unlabored           Heart/CV- RRR , no murmur , no gallop  , no rub, nl  s1 s2                           - JVD- none , edema- none, stasis changes- none, varices- none           Lung- clear to P&A, wheeze- none, cough- none , dullness-none, rub- none           Chest wall-  Abd-  Br/ Gen/ Rectal- Not done, not indicated Extrem- cyanosis- none, clubbing, none, atrophy- none, strength- nl Neuro- grossly intact to observation   .

## 2022-06-07 NOTE — Assessment & Plan Note (Signed)
Benefits from CPAP. Doing well with new machine Plan- continue auto 5-15

## 2022-07-07 ENCOUNTER — Encounter: Payer: Self-pay | Admitting: Internal Medicine

## 2023-08-24 NOTE — Progress Notes (Unsigned)
 HPI male never smoker followed for OSA, omplicated by morbid obesity, DM 2, GERD, gout, HBP Sleep study 03/16/2008-[facility], AHI 66/  He was optimal on CPAP 12-13 centimeters water.  ------------------------------------------------------------------------   06/07/22-  78 year old male never smoker followed for OSA, complicated by morbid obesity, DM 2, GERD, gout, HBP, Rhinitis(Dr [place] Callas), CPAP auto 5-15/[facility]  [email or web address]     new 2023 Download compliance- 100%, AHI 1.8/ hr Body weight today-245 lbs Covid vax-6 Phizer Flu vax-had RSV vax- had -----Pt is doing well. Pt states he got a new machine and it is working well He notes smaller AirSense 11 has smaller reservoir. Discussed adjusting humidifier so it will last the night. Breathing well.  08/25/23- 78 year old male never smoker followed for OSA, complicated by morbid obesity, DM 2, GERD, gout, HBP, Rhinitis(Dr [place] Callas), CPAP auto 5-15/[facility]  [email or web address]     new 2023 Download compliance- 100%, AHI 1.7/hr Body weight today-236 lbs Discussed the use of AI scribe software for clinical note transcription with the patient, who gave verbal consent to proceed. History of Present Illness   The patient, with a history of sleep apnea, presents for a routine follow-up. He reports satisfaction with his current CPAP machine, particularly the auto sense feature which adjusts the humidity level according to the room's dryness. However, he expresses dissatisfaction with the reduced size of the water reservoir, which occasionally runs out. Despite this, he is compliant with using the machine every night and reports no breakthrough apneas. He also mentions a medication he takes that requires him to wait 30 minutes before consuming any other liquids, which coincides with the resting period of his CPAP machine. Download reviewed.    He doesn't think he can sleep without CPAP.  ROS-see HPI    "+" = positive Constitutional:    weight loss, night sweats,  fevers, chills, fatigue, lassitude. HEENT:    headaches, difficulty swallowing, tooth/dental problems, sore throat,       sneezing, itching, ear ache, nasal congestion, post nasal drip, snoring CV:    chest pain, orthopnea, PND, swelling in lower extremities, anasarca,                                                  dizziness, palpitations Resp:   shortness of breath with exertion or at rest.               productive cough,   non-productive cough, coughing up of blood.              change in color of mucus.  wheezing.   Skin:    rash or lesions. GI:  No-   heartburn, indigestion, abdominal pain, nausea, vomiting, diarrhea,                 change in bowel habits, loss of appetite GU: dysuria, change in color of urine, no urgency or frequency.   flank pain. MS:   joint pain, stiffness, decreased range of motion, back pain. Neuro-     nothing unusual Psych:  change in mood or affect.  depression or anxiety.   memory loss.  OBJ- Physical Exam General- Alert, Oriented, Affect-appropriate, Distress- none acute, + obese Skin- rash-none, lesions- none, excoriation- none Lymphadenopathy- none Head- atraumatic            Eyes- Gross vision intact, PERRLA, conjunctivae and secretions clear  Ears- Hearing, canals-normal            Nose- Clear, no-Septal dev, mucus, polyps, erosion, perforation             Throat- Mallampati IV , mucosa clear , drainage- none, tonsils- atrophic Neck- flexible , trachea midline, no stridor , thyroid nl, carotid no bruit Chest - symmetrical excursion , unlabored           Heart/CV- RRR , no murmur , no gallop  , no rub, nl s1 s2                           - JVD- none , edema- none, stasis changes- none, varices- none           Lung- clear to P&A, wheeze- none, cough- none , dullness-none, rub- none           Chest wall-  Abd-  Br/ Gen/ Rectal- Not done, not indicated Extrem- cyanosis- none, clubbing, none, atrophy- none, strength- nl Neuro- grossly  intact to observation  Assessment and Plan:    Obstructive Sleep Apnea (OSA) OSA well-controlled with auto-adjusting CPAP, AHI <2, compliant with use. - Continue CPAP therapy with auto-adjusting settings. - Replace CPAP machine in 2028. - Continue monthly filter changes.        Marland Kitchen

## 2023-08-26 ENCOUNTER — Encounter: Payer: Self-pay | Admitting: Internal Medicine

## 2023-08-26 ENCOUNTER — Ambulatory Visit: Payer: Medicare Other | Admitting: Internal Medicine

## 2023-08-26 VITALS — BP 116/64 | HR 60 | Temp 97.6°F | Ht 73.0 in | Wt 236.8 lb

## 2023-08-26 DIAGNOSIS — G4733 Obstructive sleep apnea (adult) (pediatric): Secondary | ICD-10-CM

## 2023-08-26 NOTE — Patient Instructions (Signed)
 You're doing great with CPAP. We can continue auto 5-15  Please call if we can help

## 2024-09-02 ENCOUNTER — Encounter: Admitting: Pulmonary Disease
